# Patient Record
Sex: Female | Born: 1980 | Hispanic: No | Marital: Single | Smoking: Former smoker
Health system: Southern US, Community
[De-identification: ages and names within clinical notes are randomized; demographics above are authoritative.]

## PROBLEM LIST (undated history)

## (undated) DIAGNOSIS — J45909 Unspecified asthma, uncomplicated: Secondary | ICD-10-CM

## (undated) DIAGNOSIS — N893 Dysplasia of vagina, unspecified: Secondary | ICD-10-CM

## (undated) DIAGNOSIS — C801 Malignant (primary) neoplasm, unspecified: Secondary | ICD-10-CM

## (undated) DIAGNOSIS — F32A Depression, unspecified: Secondary | ICD-10-CM

## (undated) DIAGNOSIS — M419 Scoliosis, unspecified: Secondary | ICD-10-CM

## (undated) DIAGNOSIS — M549 Dorsalgia, unspecified: Secondary | ICD-10-CM

## (undated) DIAGNOSIS — F329 Major depressive disorder, single episode, unspecified: Secondary | ICD-10-CM

## (undated) DIAGNOSIS — M8580 Other specified disorders of bone density and structure, unspecified site: Secondary | ICD-10-CM

## (undated) DIAGNOSIS — F909 Attention-deficit hyperactivity disorder, unspecified type: Secondary | ICD-10-CM

## (undated) DIAGNOSIS — K529 Noninfective gastroenteritis and colitis, unspecified: Secondary | ICD-10-CM

## (undated) HISTORY — DX: Attention-deficit hyperactivity disorder, unspecified type: F90.9

## (undated) HISTORY — PX: DIAGNOSTIC LAPAROSCOPY: SUR761

## (undated) HISTORY — DX: Dysplasia of vagina, unspecified: N89.3

## (undated) HISTORY — PX: ABDOMINAL HYSTERECTOMY: SHX81

## (undated) HISTORY — DX: Scoliosis, unspecified: M41.9

## (undated) HISTORY — DX: Other specified disorders of bone density and structure, unspecified site: M85.80

## (undated) HISTORY — DX: Noninfective gastroenteritis and colitis, unspecified: K52.9

## (undated) HISTORY — PX: WISDOM TOOTH EXTRACTION: SHX21

---
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
## 2002-05-27 ENCOUNTER — Emergency Department (HOSPITAL_COMMUNITY): Admission: EM | Admit: 2002-05-27 | Discharge: 2002-05-28 | Payer: Self-pay | Admitting: *Deleted

## 2004-01-25 ENCOUNTER — Other Ambulatory Visit: Admission: RE | Admit: 2004-01-25 | Discharge: 2004-01-25 | Payer: Self-pay | Admitting: Gynecology

## 2005-11-24 ENCOUNTER — Emergency Department (HOSPITAL_COMMUNITY): Admission: EM | Admit: 2005-11-24 | Discharge: 2005-11-24 | Payer: Self-pay | Admitting: Emergency Medicine

## 2005-11-28 ENCOUNTER — Emergency Department (HOSPITAL_COMMUNITY): Admission: EM | Admit: 2005-11-28 | Discharge: 2005-11-29 | Payer: Self-pay | Admitting: Emergency Medicine

## 2005-12-10 ENCOUNTER — Other Ambulatory Visit: Admission: RE | Admit: 2005-12-10 | Discharge: 2005-12-10 | Payer: Self-pay | Admitting: Gynecology

## 2006-02-21 ENCOUNTER — Emergency Department (HOSPITAL_COMMUNITY): Admission: EM | Admit: 2006-02-21 | Discharge: 2006-02-21 | Payer: Self-pay | Admitting: Emergency Medicine

## 2006-11-24 ENCOUNTER — Emergency Department: Payer: Self-pay | Admitting: Unknown Physician Specialty

## 2007-02-14 ENCOUNTER — Emergency Department (HOSPITAL_COMMUNITY): Admission: EM | Admit: 2007-02-14 | Discharge: 2007-02-14 | Payer: Self-pay | Admitting: Family Medicine

## 2007-03-10 ENCOUNTER — Emergency Department (HOSPITAL_COMMUNITY): Admission: EM | Admit: 2007-03-10 | Discharge: 2007-03-10 | Payer: Self-pay | Admitting: Emergency Medicine

## 2007-03-11 ENCOUNTER — Ambulatory Visit (HOSPITAL_COMMUNITY): Admission: RE | Admit: 2007-03-11 | Discharge: 2007-03-11 | Payer: Self-pay | Admitting: Emergency Medicine

## 2007-10-07 ENCOUNTER — Encounter: Admission: RE | Admit: 2007-10-07 | Discharge: 2007-10-07 | Payer: Self-pay | Admitting: Cardiology

## 2007-11-06 HISTORY — PX: PELVIC LAPAROSCOPY: SHX162

## 2008-01-23 ENCOUNTER — Encounter: Admission: RE | Admit: 2008-01-23 | Discharge: 2008-01-23 | Payer: Self-pay | Admitting: Gastroenterology

## 2008-04-14 ENCOUNTER — Encounter (INDEPENDENT_AMBULATORY_CARE_PROVIDER_SITE_OTHER): Payer: Self-pay | Admitting: Surgery

## 2008-04-15 ENCOUNTER — Inpatient Hospital Stay (HOSPITAL_COMMUNITY): Admission: RE | Admit: 2008-04-15 | Discharge: 2008-04-16 | Payer: Self-pay | Admitting: Surgery

## 2008-11-27 IMAGING — CT CT ENTERO PELVIS W/CM
2 of 5 series · 16 of 46 positions shown, 18 images · IV contrast (VOLUMEN & [ID] OMNI 300)
Comparison: CT of the abdomen dated 10/07/07.

CLINICAL DATA: Recent small bowel obstruction.  Nausea and vomiting.  History of endodermal yolk sac tumor as child.  Status-post hysterectomy.  
CT ENTEROGRAPHY OF ABDOMEN WITH CONTRAST:
TECHNIQUE: Multidetector CT imaging of the abdomen was performed following administration of 9719 cc of VoLumen low attenuation enteric contrast.  Images were acquired after bolus injection of nonionic intravenous contrast with coronal and sagittal reformations reviewed for assessment of the small bowel mucosa.
Contrast:  100 cc Omnipaque 300
TECHNIQUE: Multidetector CT imaging of the pelvis was performed following administration of 9719 cc of VoLumen low attenuation enteric contrast.  Images were acquired after bolus injection of nonionic intravenous contrast with coronal and sagittal reformations reviewed for assessment of the small bowel mucosa.

[Series 3: enterography · axial · 0.60mm/px · z∈[-394,-32]mm · 13 of 140 slices shown, 15 images]
[im 7/140  soft-tissue]
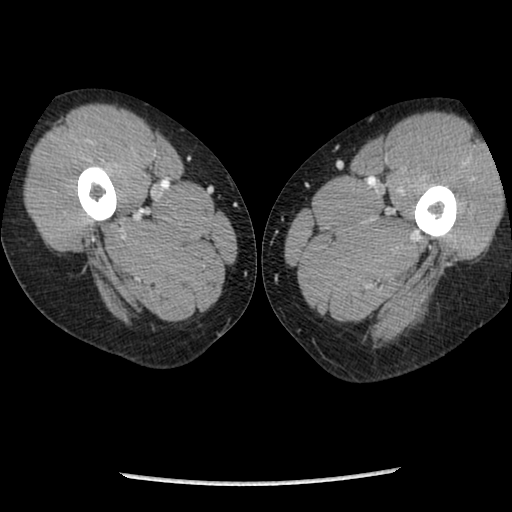
[im 7/140  bone]
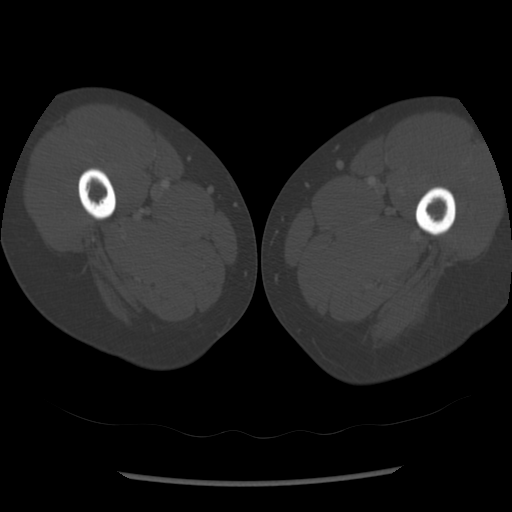
[im 20/140  soft-tissue]
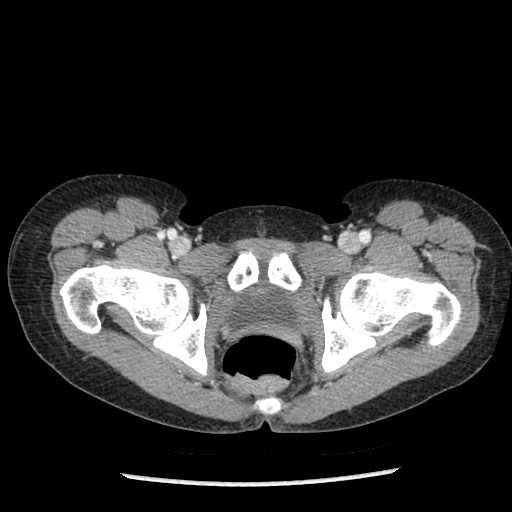
[im 27/140  soft-tissue]
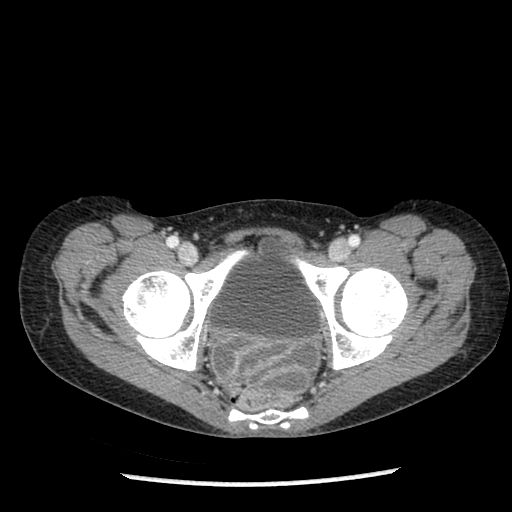
[im 40/140  soft-tissue]
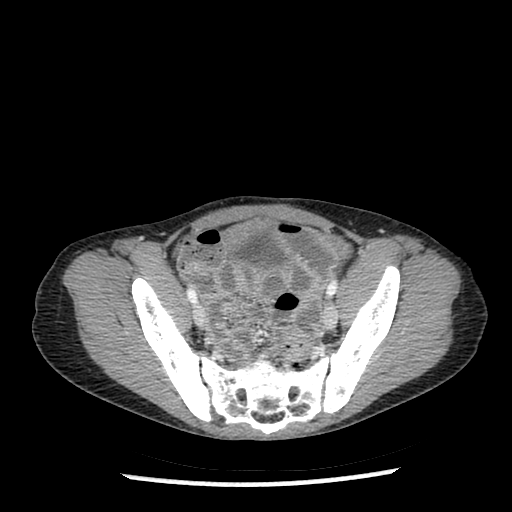
[im 47/140  soft-tissue]
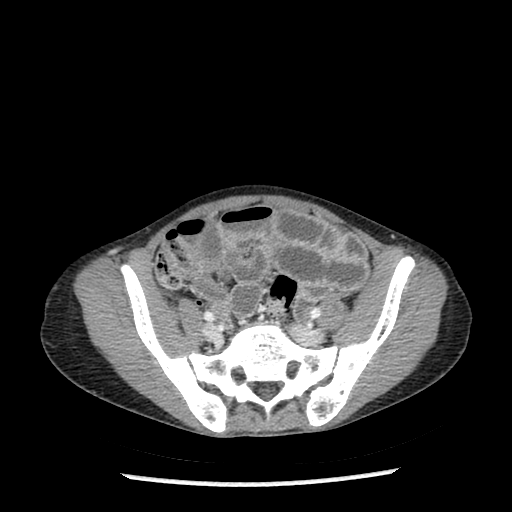
[im 60/140  soft-tissue]
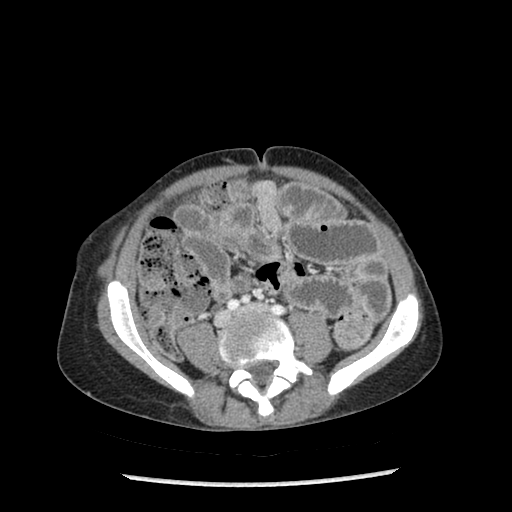
[im 73/140  soft-tissue]
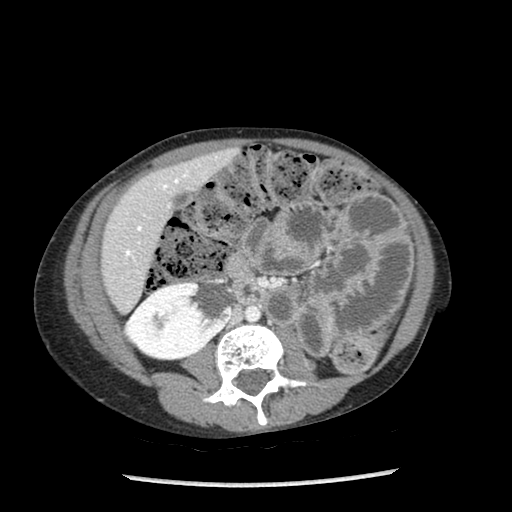
[im 80/140  soft-tissue]
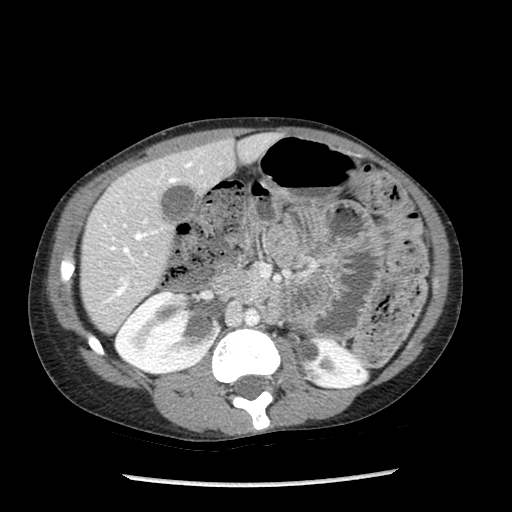
[im 93/140  soft-tissue]
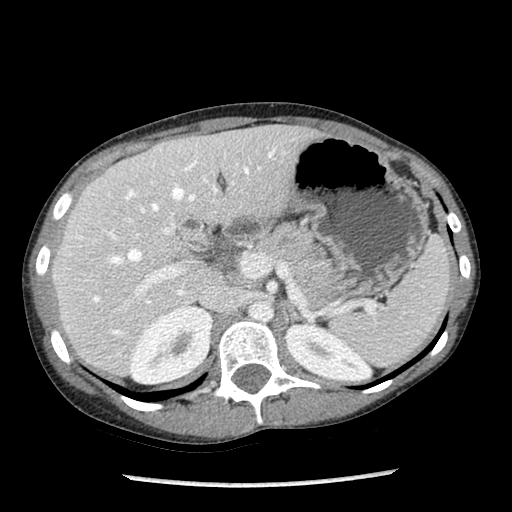
[im 93/140  bone]
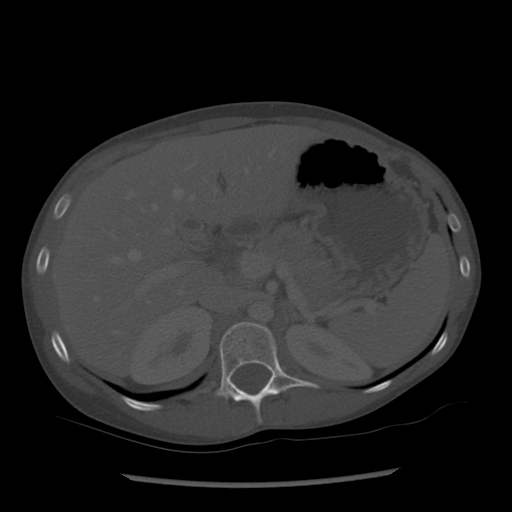
[im 100/140  soft-tissue]
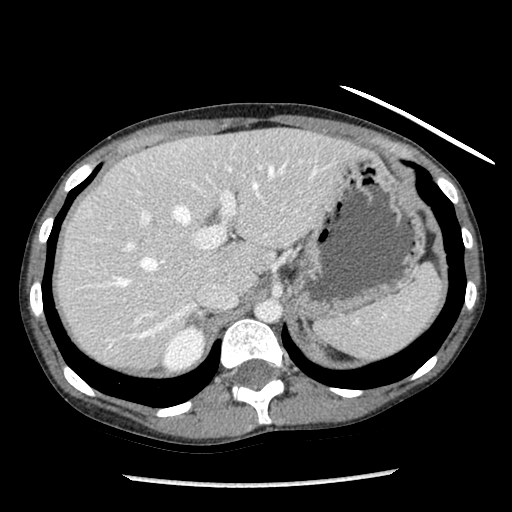
[im 113/140  soft-tissue]
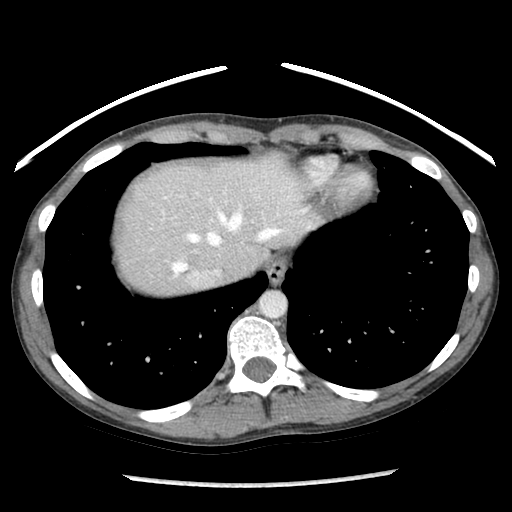
[im 120/140  soft-tissue]
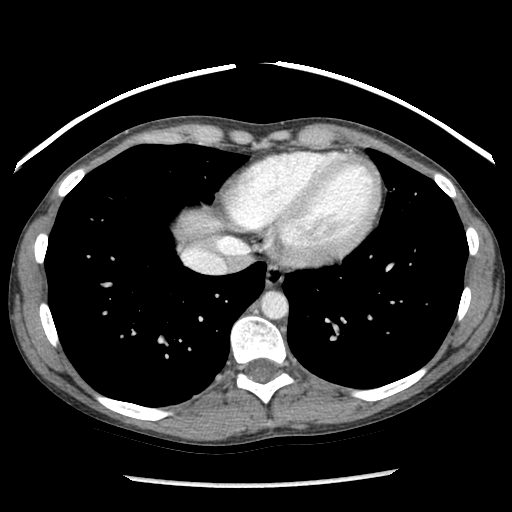
[im 133/140  soft-tissue]
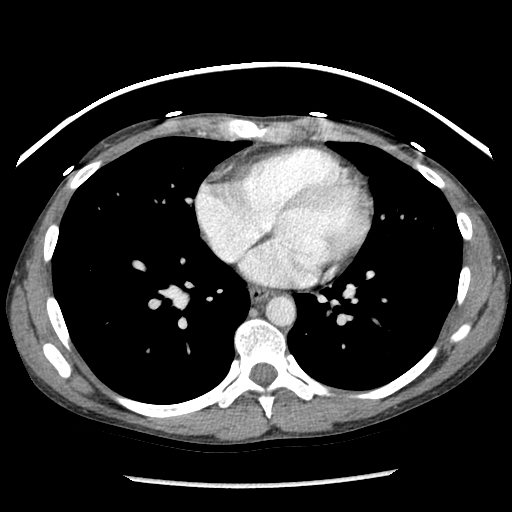

[Series 602: sagittal body · sagittal · 0.78mm/px · 3 of 124 slices shown]
[im 42/124  soft-tissue]
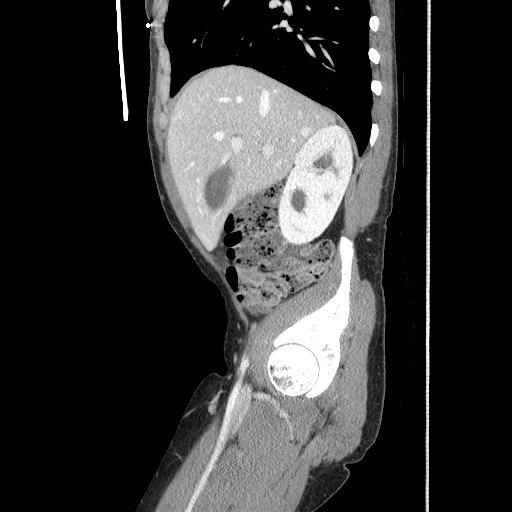
[im 55/124  soft-tissue]
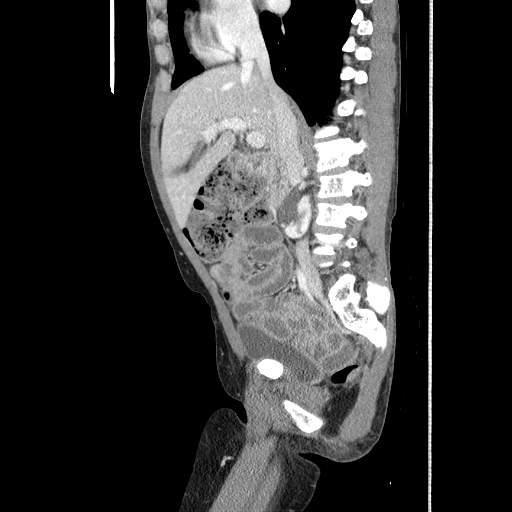
[im 69/124  soft-tissue]
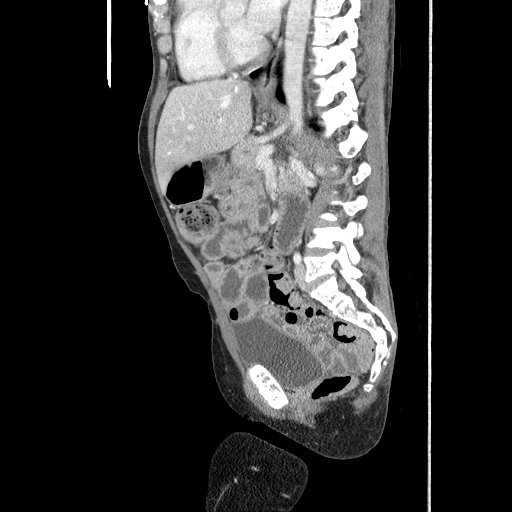

[16 of 46 positions shown; findings below may reference images not displayed]

FINDINGS: The liver is upper limits of normal in size without focal abnormality.  
The spleen, adrenal glands, gallbladder, and pancreas are unremarkable.  Fullness of both intrarenal collecting systems is again noted, right greater than left with atrophic left lower moiety. 
No enlarged lymph nodes, free fluid, biliary dilatation, or aortic aneurysm. 
Fluid is present throughout the small bowel without evidence of bowel obstruction.  Moderate stool throughout the colon is identified.  A 1 cm enhancing lesion within a small bowel loop in the left abdomen (image 73, series 3, image 63, series 601, and image 79, series 602) is suspicious for a mass or polyp.  Further evaluation/capsule endoscopy recommended.
IMPRESSION: 1.  1 cm lesion in the left jejunum.  Small mass or polyp not excluded. Recommend further evaluation.
2.  No evidence of bowel obstruction. 
3.  Moderate colonic stool.
4.  ullness of the intrarenal collecting systems bilaterally with severe atrophy of the left lower renal moiety. 
CT ENTEROGRAPHY OF PELVIS WITH CONTRAST:
FINDINGS: Small bowel and colon are within normal limits except for moderate colonic stool.  The appendix is normal.  No free fluid or enlarged lymph nodes identified.   Patient is status-post hysterectomy.  The mild deformity of the lumbar spine appears congenital.
IMPRESSION: No acute abnormalities within the pelvis.

## 2009-06-09 ENCOUNTER — Encounter: Payer: Self-pay | Admitting: Women's Health

## 2009-06-09 ENCOUNTER — Other Ambulatory Visit: Admission: RE | Admit: 2009-06-09 | Discharge: 2009-06-09 | Payer: Self-pay | Admitting: Obstetrics and Gynecology

## 2009-06-09 ENCOUNTER — Ambulatory Visit: Payer: Self-pay | Admitting: Women's Health

## 2009-08-23 ENCOUNTER — Ambulatory Visit: Payer: Self-pay | Admitting: Gynecology

## 2010-10-06 ENCOUNTER — Ambulatory Visit: Payer: Self-pay | Admitting: Women's Health

## 2010-10-06 ENCOUNTER — Other Ambulatory Visit
Admission: RE | Admit: 2010-10-06 | Discharge: 2010-10-06 | Payer: Self-pay | Source: Home / Self Care | Admitting: Gynecology

## 2010-11-26 ENCOUNTER — Encounter: Payer: Self-pay | Admitting: Gastroenterology

## 2010-11-27 ENCOUNTER — Encounter: Payer: Self-pay | Admitting: *Deleted

## 2011-03-20 NOTE — Op Note (Signed)
NAMEMYKELTI, GOLDENSTEIN               ACCOUNT NO.:  [id number]   MEDICAL RECORD NO.:  [id number]          PATIENT TYPE:  AMB   LOCATION:  DAY                          FACILITY:  [facility]   PHYSICIAN:  Ardeth Sportsman, MD     DATE OF BIRTH:  [birth year]   DATE OF PROCEDURE:  04/14/2008  DATE OF DISCHARGE:                               OPERATIVE REPORT   PRIMARY CARE PHYSICIAN:  Lenard Lance at [facility].   GASTROENTEROLOGIST:  Graylin Shiver, M.D.   PREOPERATIVE DIAGNOSES:  Intermittent abdominal pain, question  obstruction, question of jejunal tumor on CT enterography.   POSTOPERATIVE DIAGNOSES:  1. No evidence of endoluminal small bowel tumor.  2. Proximal jejunal small serosal nodules consistent with scarring      from prior radiation.  3. 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  4. 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  5. No evidence of retroperitoneal or intra-abdominal mass status post      hysterectomy, bilateral oophorectomy.   SURGEON:  Ardeth Sportsman, M.D.   ASSISTANT:  Leonie Man, M.D.   ANESTHESIA:  1. General anesthesia.  2. Local anesthetic and field block around all port sites.   PROCEDURE PERFORMED:  1. Diagnostic laparoscopy.  2. Small bowel serosal nodule excisional biopsy x2.  3. Minilaparotomy.   SPECIMENS:  1. Peritoneal fluid for aspiration for culture and cytology.  2. Small bowel serosal nodules x2.  Frozen analysis consistent with      scarring and fibrosis, most consistent with prior radiation      exposure.   DRAINS:  None.   ESTIMATED BLOOD LOSS:  5 mL.   COMPLICATIONS:  None.   INDICATIONS:  Ms. 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  She had  postop radiation and chemotherapy.  She has been struggling with  abdominal problems for most of  her life.  She has episodes of severe  nausea, vomiting and bloating and occasional diarrhea.  She is followed  by Dr. Evette Cristal with [facility] gastroenterology and workup.  She had a CAT scan  that was concerning for a possible small bowel obstruction when she was  having symptomatology.  She had a CT enterography which showed evidence  of a possible small bowel tumor in the jejunum in the left side.  Therefore, surgical consultation was requested.   Anatomy and physiology of the digestive tract was explained.  Pathophysiology of intraluminal tumors and obstruction versus prior  adhesions and other differential diagnoses were discussed.  Her risks,  benefits and alternatives were discussed.  Recommendation was made for  diagnostic laparoscopy with possible laparotomy and possible small bowel  resection and other interventions discussed.  Risks, benefits and  alternatives discussed and she agreed to proceed.  I had discussion just  prior to surgery on the day of surgery as well, questions answered, and  she agreed to proceed.  OPERATIVE FINDINGS:  She had no evidence of any intra-abdominal  adhesions.  There is no evidence of any internal herniation or  malrotation.  Her small bowel was completely of normal caliber.  She is  status post hysterectomy and oophorectomy.  She did have some slightly  greenish milky peritoneal fluid down in her pelvis, in Morison pouch  retrohepatic.  This was aspirated.  She did not have strong evidence of  peritonitis.  Her appendix and colon was normal.   She did have segments in her mid jejunum that had some scarring and  discoloration consistent with prior radiation therapy.  However, the  small bowel was not markedly thickened at this region.  There was no  evidence of any obstruction.  Small bowel was normal diameter  throughout.  There was no evidence of any retroperitoneal mass or other  abnormality.  There was no evidence of any visceral or parietal   peritoneal masses or liver masses, or kidney masses, or any other  abnormalities.   DESCRIPTION OF PROCEDURE:  />  Informed consent was confirmed.  The patient received IV antibiotics  prior to surgery.  She underwent general anesthesia without any  difficulty.  We gave her IV fluconazole as well as given her strong  history of yeast infections with this.  She was positioned supine on the  split leg table.  Her abdomen was prepped and draped in a sterile  fashion.   A 5-mm port was placed in the left upper quadrant with the patient in  steep reverse Trendelenburg and left side up using optical entry  technique and after the patient had an orogastric tube decompressing her  stomach.  Port placed easily without any difficulty.  Camera inspection  revealed no intra-abdominal injury.  Under direct visualization 5-mm  ports were placed in the left flank and suprapubically for prior  Pfannenstiel incision well away from her decompressed bladder.   Camera inspection revealed findings noted above.  She had no evidence of  really any adhesions or any other abnormalities.  I did run her small  bowel laparoscopically from her cecum all the way to the ligament of  Treitz.  Her cecum was not particularly floppy like a bascule or any  other abnormality there.  On running her small bowel she did have  segments in her mid jejunum that were discolored but not necessarily  particularly thickened.  I did see one or two spots of scarring on the  antimesenteric side of the serosa of the small bowel of little white  patches sclerosing that were anywhere from 1-4 mm length of tiny strips.  They did not seem thickened.  More proximally there was 6-mm evidence of  a serosal AVM as well.  There were a couple of spots of tiny nodules on  the serosa, and had one particular spot of 1-3 mm in size.  A camera  inspection again was done of all the other organs and there was no  abnormality.  I could see her  retroperitoneum very well, I could easily  see her retroperitoneal organs, ureter, kidney, adrenal gland, and  pancreas without any difficulty since she was very, very thin.  We did  take pictures.   Given the fact that there were some abnormalities on the small bowel  that were difficult to feel, I did do a 4-cm supraumbilical midline  incision;  after capnoperitoneum  was evacuated.  We placed a wound  protector and could run the small bowel by  hand all the way from the  ileocecal valve to the ligament of Treitz.  The areas of discoloration  concerning for prior radiation therapy were not particularly  thick-  walled.  The peristalsis was slightly less but there was definite  peristalsis within these loops.  The mesentery had normal size lymph  nodes, no evidence of any mesenteric thickening or abnormality.  There  was no evidence of any obstruction or significant change in her caliber  at all.  The white patches of scarring seen before on the antimesenteric  side in about three spaces of the small bowel were very small and  nonobstructing and not thickened.   I did find one patch with a couple of 1 to 2-mm small bowel nodules on  the serosa.  We went ahead and excised this and sent this for frozen  specimen.  It came back consistent with scar and not of concern.  I went  ahead and placed 3-0 silk stitches to help imbricate that area and it  was not obstructing either.  I came across the serosal AVM.  There was  no thickening or abnormality there.  As she was very thin, I could  easily palpate, and there was no evidence of any endoluminal mass.  I  did not do intraoperative endoscopy as I could easily feel and palpate  things.   I went ahead and closed the midline incision using #1 PDS.  The  capnoperitoneum was reintroduced and there was good closure of the  peritoneum.  Reinspection revealed no intra-abdominal injury or leak or  abnormality.  I did take some pictures and again  there was no evidence  of concern.  Based on this fact, I felt that it would not be of benefit  to resect any the radiation areas as they did not seem obstructing and  did seem to show evidence of peristalsis, so we decided to stop the  procedure.  Capnoperitoneum was evacuated and the remaining ports  removed.  Skin was closed using a 4-0 Monocryl stitch.  Sterile dressing  was applied.  The patient was extubated and sent to the recovery room in  stable condition.   I talked to Dr. Evette Cristal and explained the findings.  We will wait for  final pathology results.  I explained the operative findings to the  patient's family.      Ardeth Sportsman, MD  Electronically Signed     SCG/MEDQ  D:  04/14/2008  T:  04/14/2008  Job:  [id number]   cc:   Graylin Shiver, M.D.  Fax: [phone]   Lenard Lance  Fax: [phone]

## 2011-03-23 NOTE — Op Note (Signed)
[facility]  Patient:    Pamela Wall, Pamela Wall Visit Number: [id number] MRN: [id number]          Service Type: END Location: DAY Attending Physician:  Jonathon Bellows Dictated by:   Roetta Sessions, M.D. Proc. Date: 10/06/01 Admit Date:  10/06/2001   CC:         Wyvonnia Lora, M.D., [place], [place]  Zigmund Gottron, M.D.,  [place], [place]   Operative Report  PROCEDURE:  Colonoscopy with biopsy.  ENDOSCOPIST:  Roetta Sessions, M.D.  INDICATIONS:  The patient is an 30 year old lady with essentially lifelong diarrhea.  Colonoscopy is now being done to further evaluate her symptoms. She has a history of pelvic radiation for uterine cancer.  This approach has been discussed with the patient previously.  The potential risks, benefits and alternatives have been reviewed; please see the office documentation and my handwritten updated H&P for more information.  I think  she is at low risk for conscious sedation with Versed and Demerol.  MONITORING:  O2 saturation, blood pressure, pulse, respirations were monitored throughout the entire procedure.  CONSCIOUS SEDATION:  IV Versed and Demerol in incremental doses.  INSTRUMENT:  Olympus video-chip colonoscope.  FINDINGS:  Digital rectal examination revealed no abnormalities.  Endoscopic findings: The prep was good.  Rectum: Examination of the rectal mucosa including retroflexed view of the anal verge revealed no abnormalities.  Colon: The colonic mucosa was surveyed from the rectosigmoid junction through the left transverse and  right colon to the area of the appendiceal orifice, the ileocecal valve and cecum.  These structures were well seen and photographed.  The colonic mucosa appeared normal all the way to the cecum. The ileocecal valve was skinny.  I attempted to intubate the terminal ileum but was unable to do so.  From the level of the cecum and ileocecal valve the scope was slowly withdrawn and all previously  mentioned mucosal surfaces were again seen.  There was a 5 mm polyp at 20 cm which was cold biopsied/removed. Also segmental biopsies of the descending, sigmoid and rectal mucosa were taken separately.  The mucosa in these segments otherwise appeared normal. Also there was some stool residue in the colon which was suctioned for studies.  The patient tolerated the procedure very well and was reacted.  IMPRESSION: 1. Normal rectum. 2. Diminutive polyp at 20 cm cold biopsied/removed. 3. The remainder of colonic mucosa appeared normal status post stool sampling    and biopsies as described above.  RECOMMENDATIONS: 1. Follow up on stool studies and biopsies. 2. Further recommendations to follow. Dictated by:   Roetta Sessions, M.D. Attending Physician:  Jonathon Bellows DD:  10/06/01 TD:  10/06/01 Job: [id number]

## 2011-03-23 NOTE — Op Note (Signed)
[facility]  Patient:    JAMARIA, AMBORN Visit Number: [id number] MRN: [id number]          Service Type: END Location: DAY Attending Physician:  Jonathon Bellows Dictated by:   Roetta Sessions, M.D. Proc. Date: 12/04/01 Admit Date:  12/04/2001                             Operative Report  PROCEDURE:  Esophagogastroduodenoscopy with small bowel biopsy.  INDICATIONS FOR PROCEDURE:  The patient is a 30 year old lady with chronic diarrhea.  She has history of radiation therapy to her pelvis for uterine tumor at 2 months of age.  She had a small bowel follow through which demonstrated thickening, abnormal-appearing small bowel mucosa.  Celiac antibody was negative.  She has significant gastroesophageal reflux symptoms.  EGD is now being done to look at her small bowel and obtain biopsies as needed.  This approach has been discussed with the patient previously and again today at the bedside. Potential risks, benefits and alternatives have been reviewed and questions answered.  She is agreeable.  Please see my handwritten H&P for more information.  She is at low risk for conscious sedation with Versed and Demerol  DESCRIPTION OF PROCEDURE:  The patient was placed in the left lateral decubitus position.  Oxygen saturation, blood pressure, pulse, respirations were monitored throughout the entire procedure.  Conscious sedation:  Versed 5 mg, Demerol 75 mg IV in divided doses.  Cetacaine spray for topical oropharyngeal anesthesia.  Instrument was the Olympus videotape gastroscope.  FINDINGS:  Examination of tubular esophagus revealed multiple distal esophageal erosions.  There was one relatively long 3 cm erosion extending  up from the EG junction.  There was also multiple smaller circumferential erosions.  The EG junction was somewhat patulous.  There was no Barretts esophagus.  The EG junction was easily traversed.  STOMACH:  The gastric cavity was  empty, insufflated well with air.  A thorough examination of the gastric mucosa including retroflexed view of the proximal stomach, esophagogastric junction demonstrated only a small hiatal hernia. Pylorus was patent and easily traversed.  DUODENUM:  Second and third portion appeared somewhat pale and had a little difficulty fully distending the second and third portion with air.  Second and third portions were biopsied.  The patient tolerated the procedure and was reactive to endoscopy.  IMPRESSION: 1. Multiple distal esophageal erosions with a moderately severe    erosive reflux esophagitis, patulous esophagogastric junction. 2. Small hiatal hernia.  The remainder of the stomach appeared    normal. 3. Slightly pale and poorly distensible second and third portions    of the duodenum, status post biopsies.  RECOMMENDATIONS: 1. Will start Aciphex 20 mg orally daily, 30 minutes before    breakfast; antireflux measures emphasized.  Literature provided on    gastroesophageal reflux disease. 2. Follow up on pathology. 3. Further recommendations to follow. Dictated by:   Roetta Sessions, M.D. Attending Physician:  Jonathon Bellows DD:  12/04/01 TD:  12/04/01 Job: [id number]

## 2011-05-04 ENCOUNTER — Ambulatory Visit: Payer: Self-pay | Admitting: Women's Health

## 2011-05-18 ENCOUNTER — Encounter: Payer: Self-pay | Admitting: Women's Health

## 2011-07-25 ENCOUNTER — Encounter: Payer: Self-pay | Admitting: Women's Health

## 2011-07-25 ENCOUNTER — Ambulatory Visit (INDEPENDENT_AMBULATORY_CARE_PROVIDER_SITE_OTHER): Payer: Medicaid Other | Admitting: Women's Health

## 2011-07-25 ENCOUNTER — Encounter: Payer: Self-pay | Admitting: Gynecology

## 2011-07-25 ENCOUNTER — Ambulatory Visit: Payer: Self-pay | Admitting: Women's Health

## 2011-07-25 DIAGNOSIS — A499 Bacterial infection, unspecified: Secondary | ICD-10-CM

## 2011-07-25 DIAGNOSIS — B9689 Other specified bacterial agents as the cause of diseases classified elsewhere: Secondary | ICD-10-CM

## 2011-07-25 DIAGNOSIS — N760 Acute vaginitis: Secondary | ICD-10-CM

## 2011-07-25 DIAGNOSIS — N898 Other specified noninflammatory disorders of vagina: Secondary | ICD-10-CM

## 2011-07-25 DIAGNOSIS — B373 Candidiasis of vulva and vagina: Secondary | ICD-10-CM

## 2011-07-25 MED ORDER — METRONIDAZOLE 0.75 % VA GEL: VAGINAL | Status: AC

## 2011-07-25 MED ORDER — FLUCONAZOLE 150 MG PO TABS: 150.0000 mg | ORAL_TABLET | Freq: Once | ORAL | Status: AC

## 2011-07-25 NOTE — Progress Notes (Signed)
  Patient presents with a complaint of a bump in the vaginal area that is slightly tender. Denies  pain or burning with urination, or fever. States has had slight discharge. Abdomen is soft nontender external genitalia there is 1 mm area on the left vaginal opening that appears to be white, questionable yeast versus condyloma. 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 Of significance she did break off 7y engagement, and is sexually active with a new partner with condoms.  BV, yeast, questionable condyloma  Plan: MetroGel vaginal cream 1 applicator at bedtime x5, Diflucan 150 by mouth x1 dose yeast prevention discussed, will return for an annual, HIV, hepatitis and RPR and Pap smear.

## 2011-08-02 LAB — DIFFERENTIAL
Basophils Relative: 1
Eosinophils Relative: 3
Lymphocytes Relative: 39
Monocytes Absolute: 0.3
Monocytes Relative: 5
Neutro Abs: 3.1
Neutrophils Relative %: 54

## 2011-08-02 LAB — ANAEROBIC CULTURE: Gram Stain: NONE SEEN

## 2011-08-02 LAB — BODY FLUID CULTURE

## 2011-08-02 LAB — CBC
MCV: 94.5
WBC: 5.9

## 2011-08-09 ENCOUNTER — Other Ambulatory Visit: Payer: Self-pay | Admitting: Gynecology

## 2011-08-16 ENCOUNTER — Encounter: Payer: Self-pay | Admitting: Gynecology

## 2011-08-16 ENCOUNTER — Ambulatory Visit (INDEPENDENT_AMBULATORY_CARE_PROVIDER_SITE_OTHER): Payer: Medicaid Other | Admitting: Gynecology

## 2011-08-16 VITALS — BP 110/70 | Wt 104.0 lb

## 2011-08-16 DIAGNOSIS — B373 Candidiasis of vulva and vagina: Secondary | ICD-10-CM

## 2011-08-16 DIAGNOSIS — R82998 Other abnormal findings in urine: Secondary | ICD-10-CM

## 2011-08-16 DIAGNOSIS — L293 Anogenital pruritus, unspecified: Secondary | ICD-10-CM

## 2011-08-16 DIAGNOSIS — R300 Dysuria: Secondary | ICD-10-CM

## 2011-08-16 DIAGNOSIS — N898 Other specified noninflammatory disorders of vagina: Secondary | ICD-10-CM

## 2011-08-16 DIAGNOSIS — N390 Urinary tract infection, site not specified: Secondary | ICD-10-CM

## 2011-08-16 MED ORDER — FLUCONAZOLE 200 MG PO TABS: 200.0000 mg | ORAL_TABLET | Freq: Every day | ORAL | Status: AC

## 2011-08-16 MED ORDER — CIPROFLOXACIN HCL 250 MG PO TABS: 250.0000 mg | ORAL_TABLET | Freq: Two times a day (BID) | ORAL | Status: AC

## 2011-08-16 NOTE — Patient Instructions (Signed)
Take medications as prescibed

## 2011-08-16 NOTE — Progress Notes (Signed)
Patient notes some frequency and dysuria as well as vaginal itching of the last week to 2 weeks. No real discharge. No fevers chills low back pain or other constitutional symptoms.  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  Exam Spine: Straight without paraspinous muscle spasm or CVA tenderness Abdomen: Soft nontender without masses guarding rebound organomegaly. Pelvic: External BUS of vagina with slight white discharge vaginal length shortened consistent with her past surgical history  Assessment and plan 1. Vaginal discharge, itching. KOH wet prep is positive for yeast. She recently was treated by Harriett Sine with Diflucan 150x1 dose. We'll go ahead and treat with Diflucan 200 daily x3 days follow up if symptoms persist or recur. 2. Urgency frequency. UA suspicious for UTI. Will cover with ciprofloxacin 250 twice a day x7 days given her history of radiation treatment in the past. To follow up if her symptoms persist or recur

## 2011-08-17 ENCOUNTER — Ambulatory Visit: Payer: Medicaid Other | Admitting: Gynecology

## 2011-08-17 ENCOUNTER — Telehealth: Payer: Self-pay | Admitting: *Deleted

## 2011-08-17 NOTE — Telephone Encounter (Signed)
Pt called stating pharmacy never got Rx for Cipro. Rx called in to pharmacy per request.

## 2011-09-06 DIAGNOSIS — N893 Dysplasia of vagina, unspecified: Secondary | ICD-10-CM

## 2011-09-06 HISTORY — DX: Dysplasia of vagina, unspecified: N89.3

## 2011-09-13 ENCOUNTER — Encounter: Payer: Self-pay | Admitting: Women's Health

## 2011-09-13 ENCOUNTER — Other Ambulatory Visit (HOSPITAL_COMMUNITY)
Admission: RE | Admit: 2011-09-13 | Discharge: 2011-09-13 | Disposition: A | Discharge status: Home / Self Care | Payer: Medicaid Other | Source: Ambulatory Visit | Attending: Women's Health | Admitting: Women's Health

## 2011-09-13 ENCOUNTER — Ambulatory Visit (INDEPENDENT_AMBULATORY_CARE_PROVIDER_SITE_OTHER): Payer: Medicaid Other | Admitting: Women's Health

## 2011-09-13 VITALS — BP 110/72 | Ht 61.0 in | Wt 105.0 lb

## 2011-09-13 DIAGNOSIS — N898 Other specified noninflammatory disorders of vagina: Secondary | ICD-10-CM

## 2011-09-13 DIAGNOSIS — A499 Bacterial infection, unspecified: Secondary | ICD-10-CM

## 2011-09-13 DIAGNOSIS — K529 Noninfective gastroenteritis and colitis, unspecified: Secondary | ICD-10-CM | POA: Insufficient documentation

## 2011-09-13 DIAGNOSIS — B9689 Other specified bacterial agents as the cause of diseases classified elsewhere: Secondary | ICD-10-CM

## 2011-09-13 DIAGNOSIS — R82998 Other abnormal findings in urine: Secondary | ICD-10-CM

## 2011-09-13 DIAGNOSIS — Z124 Encounter for screening for malignant neoplasm of cervix: Secondary | ICD-10-CM

## 2011-09-13 DIAGNOSIS — M419 Scoliosis, unspecified: Secondary | ICD-10-CM | POA: Insufficient documentation

## 2011-09-13 DIAGNOSIS — N760 Acute vaginitis: Secondary | ICD-10-CM

## 2011-09-13 DIAGNOSIS — M8580 Other specified disorders of bone density and structure, unspecified site: Secondary | ICD-10-CM | POA: Insufficient documentation

## 2011-09-13 DIAGNOSIS — Z01419 Encounter for gynecological examination (general) (routine) without abnormal findings: Secondary | ICD-10-CM | POA: Insufficient documentation

## 2011-09-13 DIAGNOSIS — L293 Anogenital pruritus, unspecified: Secondary | ICD-10-CM

## 2011-09-13 MED ORDER — METRONIDAZOLE 0.75 % VA GEL: VAGINAL | Status: AC

## 2011-09-13 NOTE — Progress Notes (Signed)
Pamela Wall Jun 01, 1981 [id number]    History:    The patient presents for complaint of discharge. Has full custody of niece and nephew ages 28 and 28. Sister drug addict, children's father drug addict and abuse. 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   ROS:  A  ROS was performed and pertinent positives and negatives are included in the history.  Exam:  Filed Vitals:   09/13/11 1425  BP: 110/72    General appearance:  Normal Head/Neck:  Normal, without cervical or supraclavicular adenopathy. Thyroid:  Symmetrical, normal in size, without palpable masses or nodularity. Respiratory  Effort:  Normal  Auscultation:  Clear without wheezing or rhonchi Cardiovascular  Auscultation:  Regular rate, without rubs, murmurs or gallops  Edema/varicosities:  Not grossly evident Abdominal  Soft,nontender, without masses, guarding or rebound.  Liver/spleen:  No organomegaly noted  Hernia:  None appreciated  Skin  Inspection:  Grossly normal  Palpation:  Grossly normal Neurologic/psychiatric  Orientation:  Normal with appropriate conversation.  Mood/affect:  Normal  Genitourinary    Breasts: Examined lying and sitting.     Right: Without masses, retractions, discharge or axillary adenopathy.     Left: Without masses, retractions, discharge or axillary adenopathy.   Inguinal/mons:  Normal without inguinal adenopathy  External genitalia:  Normal  BUS/Urethra/Skene's glands:  Normal  Bladder:  Normal  Vagina:  contracted  Cervix:  absentl  Uterus:  absent  Adnexa/parametria:     Rt: Without masses or tenderness.   Lt: Without masses or tenderness.  Anus and perineum: Normal  Digital rectal exam: Normal sphincter tone without palpated masses or tenderness  Assessment/Plan:  30 y.o. SWF G0 for complaint of vaginal discharge and slight burning with urination. History of a TAH/BSO  for endodermal sinus tumor at age 28 months, cancer free since. On ERT per primary care/"natural"cream. History of osteoporosis, had been on Fosamax weekly. We do not have a copy of her DEXA will repeat scan and have report sent to our office. UA 3-5 WBCs trace bacteria.  BV History of osteoporosis/scoliosis Rule out UTI  Plan: MetroGel vaginal cream 1 applicator at bedtime x5, prescription, proper use was given and reviewed. SBEs, exercise, MVI daily, calcium rich foods encouraged. Will schedule DEXA and have results sent to our office. Currently on a plant/ natural ERT per primary care. Will continue labs at primary care, [place], will check culture. Caring for niece with severe ADHD and nephew for greater than one year now/ sister drug abuse, lost custody. Encouraged counseling for caring for her children/family issues. Encouraged vaginal lubricants with intercourse, vagina is extremely contracted/small and states is not able to have full penetration with intercourse.     Harrington Challenger WHNP, 3:20 PM 09/13/2011

## 2011-09-14 MED ORDER — SULFAMETHOXAZOLE-TRIMETHOPRIM 800-160 MG PO TABS: 1.0000 | ORAL_TABLET | Freq: Two times a day (BID) | ORAL | Status: AC

## 2011-09-14 NOTE — Progress Notes (Signed)
Addended by: Venora Maples on: 09/14/2011 02:32 PM   Modules accepted: Orders

## 2011-09-25 ENCOUNTER — Encounter: Payer: Self-pay | Admitting: Gynecology

## 2011-09-25 ENCOUNTER — Ambulatory Visit: Payer: Medicaid Other | Admitting: Gynecology

## 2011-09-25 ENCOUNTER — Ambulatory Visit (INDEPENDENT_AMBULATORY_CARE_PROVIDER_SITE_OTHER): Payer: Medicaid Other | Admitting: Gynecology

## 2011-09-25 DIAGNOSIS — R87612 Low grade squamous intraepithelial lesion on cytologic smear of cervix (LGSIL): Secondary | ICD-10-CM

## 2011-09-25 DIAGNOSIS — A499 Bacterial infection, unspecified: Secondary | ICD-10-CM

## 2011-09-25 DIAGNOSIS — B9689 Other specified bacterial agents as the cause of diseases classified elsewhere: Secondary | ICD-10-CM

## 2011-09-25 DIAGNOSIS — B373 Candidiasis of vulva and vagina: Secondary | ICD-10-CM

## 2011-09-25 DIAGNOSIS — N890 Mild vaginal dysplasia: Secondary | ICD-10-CM

## 2011-09-25 DIAGNOSIS — N893 Dysplasia of vagina, unspecified: Secondary | ICD-10-CM

## 2011-09-25 DIAGNOSIS — R82998 Other abnormal findings in urine: Secondary | ICD-10-CM

## 2011-09-25 DIAGNOSIS — N898 Other specified noninflammatory disorders of vagina: Secondary | ICD-10-CM

## 2011-09-25 DIAGNOSIS — N760 Acute vaginitis: Secondary | ICD-10-CM

## 2011-09-25 MED ORDER — METRONIDAZOLE 500 MG PO TABS: 500.0000 mg | ORAL_TABLET | Freq: Two times a day (BID) | ORAL | Status: AC

## 2011-09-25 MED ORDER — TERCONAZOLE 0.4 % VA CREA: 1.0000 | TOPICAL_CREAM | Freq: Every day | VAGINAL | Status: AC

## 2011-09-25 NOTE — Progress Notes (Signed)
Patient presents for for colposcopy due to Pap smear showing low-grade SIL. This is her first abnormal Pap smear. 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 Also notes that she is persisting to have a vaginal discharge and itching.  Exam External BUS vagina with thick white discharge KOH wet prep done. Small raised white lesion right hymenal ring area near the posterior fourchette. Bimanual without masses or tenderness.  Colposcopic: Acetic acid cleanse with multiple raised acetyl white lesions throughout the upper vagina consistent with HPV effect. 2 representative biopsies were taken. Patient tolerated well.  Assessment and plan: 1. White discharge. KOH wet prep is positive for yeast and BV. We'll treat with Terazol 7 day cream and Flagyl 500 twice a day x7 days all points discussed. 2. 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 Representative biopsies were taken. Patient is in a new relationship and has not use condoms consistently. I think this accounts for her Pap smear.  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 She'll follow up her biopsy results as long as low-grade that we'll plan on every six-month Pap smear and follow expectantly at present.

## 2011-09-25 NOTE — Progress Notes (Signed)
Addended by: Landis Martins R on: 09/25/2011 02:59 PM   Modules accepted: Orders

## 2011-09-27 LAB — URINE CULTURE: Colony Count: >=100000

## 2011-10-01 MED ORDER — NITROFURANTOIN MONOHYD MACRO 100 MG PO CAPS: 100.0000 mg | ORAL_CAPSULE | Freq: Two times a day (BID) | ORAL | Status: AC

## 2011-10-01 NOTE — Progress Notes (Signed)
Addended by: Dara Lords on: 10/01/2011 05:01 PM   Modules accepted: Orders

## 2011-10-02 NOTE — Progress Notes (Signed)
rx called in.  escribe was down.

## 2011-10-12 ENCOUNTER — Encounter: Payer: Self-pay | Admitting: Gynecology

## 2011-10-12 ENCOUNTER — Telehealth: Payer: Self-pay | Admitting: *Deleted

## 2011-10-12 NOTE — Telephone Encounter (Signed)
Tell patient that I apologize, I thought that we had called her but the biopsy was consistent with the Pap smear showing low-grade atypia. My recommendation is to follow up for Pap smear in 6 months and I will follow her expectantly every 6 months with Pap smears.

## 2011-10-12 NOTE — Telephone Encounter (Signed)
Pt called wanting to know results from recent pathology report. Please advise

## 2011-10-15 NOTE — Telephone Encounter (Signed)
Pt informed with the below note, recall letter in computer.

## 2011-10-15 NOTE — Telephone Encounter (Signed)
Lm for pt to call

## 2011-11-22 ENCOUNTER — Other Ambulatory Visit: Payer: Medicaid Other

## 2011-11-22 ENCOUNTER — Telehealth: Payer: Self-pay | Admitting: *Deleted

## 2011-11-22 ENCOUNTER — Other Ambulatory Visit: Payer: Self-pay | Admitting: Gynecology

## 2011-11-22 DIAGNOSIS — N390 Urinary tract infection, site not specified: Secondary | ICD-10-CM

## 2011-11-22 NOTE — Telephone Encounter (Signed)
Pt called c/o recurrent UTI frequent urination and burning. Pt had OV 08/16/11 and giving cirpo 250 mg and septra ds on 09/13/11. Pt would like rx to help relief UTI. Please advise

## 2011-11-22 NOTE — Telephone Encounter (Signed)
Recommend patient drop off a urine culture and sensitivity so that we can check not only for bacteria but check what antibiotics it is sensitive to before we treat again.

## 2011-11-22 NOTE — Telephone Encounter (Signed)
PT INFORMED WITH THE BELOW NOTE, SHE WILL DROP OFF URINE CULTURE

## 2011-11-24 LAB — URINE CULTURE

## 2011-12-20 ENCOUNTER — Telehealth: Payer: Self-pay | Admitting: *Deleted

## 2011-12-20 MED ORDER — FLUCONAZOLE 150 MG PO TABS: 150.0000 mg | ORAL_TABLET | Freq: Once | ORAL | Status: DC

## 2011-12-20 MED ORDER — FLUCONAZOLE 150 MG PO TABS: 150.0000 mg | ORAL_TABLET | Freq: Once | ORAL | Status: AC

## 2011-12-20 NOTE — Telephone Encounter (Signed)
Pt informed with the below note, rx sent to pharmacy. 

## 2011-12-20 NOTE — Telephone Encounter (Signed)
Please call in Diflucan 150 with refill office visit if no relief. Thanks

## 2011-12-20 NOTE — Telephone Encounter (Signed)
Pt called c/o yeast infection x 2 days now she got bite by a cat and went to ED and was given antibody to take. Pt said she is very busy with school and working to make appointment. Itching white discharge. She would like a rx for diflucan with refills. Please advise

## 2012-02-06 ENCOUNTER — Encounter: Payer: Self-pay | Admitting: Gynecology

## 2012-02-06 ENCOUNTER — Ambulatory Visit (INDEPENDENT_AMBULATORY_CARE_PROVIDER_SITE_OTHER): Payer: Medicaid Other | Admitting: Gynecology

## 2012-02-06 DIAGNOSIS — N890 Mild vaginal dysplasia: Secondary | ICD-10-CM

## 2012-02-06 DIAGNOSIS — N898 Other specified noninflammatory disorders of vagina: Secondary | ICD-10-CM

## 2012-02-06 DIAGNOSIS — A630 Anogenital (venereal) warts: Secondary | ICD-10-CM

## 2012-02-06 DIAGNOSIS — Z113 Encounter for screening for infections with a predominantly sexual mode of transmission: Secondary | ICD-10-CM

## 2012-02-06 DIAGNOSIS — N893 Dysplasia of vagina, unspecified: Secondary | ICD-10-CM

## 2012-02-06 LAB — WET PREP FOR TRICH, YEAST, CLUE
Trich, Wet Prep: NONE SEEN
WBC, Wet Prep HPF POC: NONE SEEN

## 2012-02-06 MED ORDER — CLINDAMYCIN PHOSPHATE 2 % VA CREA: 1.0000 | TOPICAL_CREAM | Freq: Every day | VAGINAL | Status: AC

## 2012-02-06 NOTE — Patient Instructions (Signed)
Follow up for biopsy results. Use Cleocin vaginal cream as prescribed. Follow up for annual exam with Pap smear next 1-2 months.

## 2012-02-06 NOTE — Progress Notes (Signed)
Patient ID: Pamela Wall, female   DOB: 1981-06-17, 31 y.o.   MRN: [id number]  Patient presents with several issues. She felt a bump on the outer side of her vulva and is worried that this may be a condyloma wants to have a suspected. 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 She also asked about STD screening. She has no known exposures but would prefer to be screened recently getting out of a relationship.  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  Exam was Sherrilyn Rist chaperone present External with classic condyloma right upper labia majora. No other lesions noted. Clitoris generous in size. BUS vagina with condylomatous-appearing growth at the hymenal ring 7:00 position into the vagina approximately 1 cm long. Thick white discharge. No other palpable or visible vaginal abnormalities. Bimanual without masses or tenderness.  Marland KitchenPhysical Exam  Genitourinary:       Assessment and plan: 1. White discharge. Wet prep is consistent with BV. The patient's choice we'll treat with Cleocin vaginal cream nightly times a week. 2. STD screening. I did a GC chlamydia swab of the upper vagina and urethra. Hepatitis B, hepatitis C, RPR, HIV ordered. 3. Warty growths consistent with condyloma. Both areas were cleansed with Betadine and infiltrated with subcutaneous 1% lidocaine. #1 was excised in its entirety sent to pathology with silver nitrate hemostasis afterwards. #2 was sampled with silver nitrate applied afterwards specimen sent to pathology. Reviewed with the patient #2 was too large to excise a degree in an office setting. Options for management to include OR excision or laser versus TCA application. We'll follow up on biopsy and of classic condyloma then we'll plan on TCA office application which may require several treatments.  4. VAIN I. Patient is due for her repeat Pap next month and she'll follow up for an annual at that  time 5. Generous clitoral size. Not overt clitoral megaly. She is on palate testosterone and estrogen with buccal progesterone through a clinic. I did discuss with her that the testosterone may be promoting some clitoral prominence although she is having no other symptoms such as voice changes acne or hair changes. I just asked her to keep an eye on this to make sure it doesn't change herself exam.

## 2012-02-07 LAB — GC/CHLAMYDIA PROBE AMP, GENITAL
Chlamydia, DNA Probe: NEGATIVE
GC Probe Amp, Genital: NEGATIVE

## 2012-02-07 LAB — HEPATITIS C ANTIBODY: HCV Ab: NEGATIVE

## 2012-02-13 ENCOUNTER — Telehealth: Payer: Self-pay | Admitting: *Deleted

## 2012-02-13 NOTE — Telephone Encounter (Signed)
Left message on pt voicemail that labs were negative.

## 2012-02-13 NOTE — Telephone Encounter (Signed)
Message copied by Aura Camps on Wed Feb 13, 2012 11:21 AM ------      Message from: Leanor Kail      Created: Wed Feb 13, 2012  9:26 AM       Toniann Fail asked Harriett Sine to write the excuses so she only needs lab results now.            Thanks      ----- Message -----         From: Leanor Kail         Sent: 02/13/2012   9:04 AM           To: Erik Obey            Patient would like a call regarding her labs drawn 02/06/12 [phone] & request a note excusing her from class & work on 4/4,5,8 & 9.            She is returning to work & school today [phone]            Thanks

## 2012-02-18 ENCOUNTER — Telehealth: Payer: Self-pay | Admitting: Gynecology

## 2012-02-18 NOTE — Telephone Encounter (Signed)
Please call and tell pt the below note.

## 2012-02-18 NOTE — Telephone Encounter (Signed)
Tell patient that both biopsies showed low-grade atypia. I recommended she return now so that we can treat that one vaginal area with TCA.  And then we'll continue to follow with reexamination and Pap smear in 6 months.

## 2012-02-19 NOTE — Telephone Encounter (Signed)
Lm for patient to call

## 2012-02-19 NOTE — Telephone Encounter (Signed)
Patient informed. Will call back with her calender to schedule.

## 2012-03-18 ENCOUNTER — Telehealth: Payer: Self-pay | Admitting: *Deleted

## 2012-03-18 NOTE — Telephone Encounter (Signed)
Pt has appointment on Friday for TCA treatment and she is currently taken prednisone, pt said on the bottle it said if having any type of procedure this medication may slow recovery time. Pt would like to know if she should reschedule appointment due taken medication? Please advise

## 2012-03-18 NOTE — Telephone Encounter (Signed)
Left message on pt voicemail

## 2012-03-18 NOTE — Telephone Encounter (Signed)
It should be okay.

## 2012-03-21 ENCOUNTER — Encounter: Payer: Self-pay | Admitting: Gynecology

## 2012-03-21 ENCOUNTER — Ambulatory Visit (INDEPENDENT_AMBULATORY_CARE_PROVIDER_SITE_OTHER): Payer: Medicaid Other | Admitting: Gynecology

## 2012-03-21 ENCOUNTER — Other Ambulatory Visit (HOSPITAL_COMMUNITY)
Admission: RE | Admit: 2012-03-21 | Discharge: 2012-03-21 | Disposition: A | Discharge status: Home / Self Care | Payer: Medicaid Other | Source: Ambulatory Visit | Attending: Gynecology | Admitting: Gynecology

## 2012-03-21 DIAGNOSIS — N893 Dysplasia of vagina, unspecified: Secondary | ICD-10-CM

## 2012-03-21 DIAGNOSIS — Z01419 Encounter for gynecological examination (general) (routine) without abnormal findings: Secondary | ICD-10-CM | POA: Insufficient documentation

## 2012-03-21 DIAGNOSIS — N898 Other specified noninflammatory disorders of vagina: Secondary | ICD-10-CM

## 2012-03-21 DIAGNOSIS — R350 Frequency of micturition: Secondary | ICD-10-CM

## 2012-03-21 DIAGNOSIS — N890 Mild vaginal dysplasia: Secondary | ICD-10-CM

## 2012-03-21 DIAGNOSIS — N342 Other urethritis: Secondary | ICD-10-CM

## 2012-03-21 LAB — URINALYSIS W MICROSCOPIC + REFLEX CULTURE
Bilirubin Urine: NEGATIVE
Ketones, ur: NEGATIVE mg/dL
Nitrite: NEGATIVE
Protein, ur: NEGATIVE mg/dL
Specific Gravity, Urine: <1.005 — ABNORMAL LOW (ref 1.005–1.030)
Urobilinogen, UA: 0.2 mg/dL (ref 0.0–1.0)

## 2012-03-21 MED ORDER — SULFAMETHOXAZOLE-TMP DS 800-160 MG PO TABS: 1.0000 | ORAL_TABLET | Freq: Two times a day (BID) | ORAL | Status: AC

## 2012-03-21 NOTE — Progress Notes (Signed)
1. Patient ID: Pamela Wall, female   DOB: 1981-10-18, 31 y.o.   MRN: [id number]  Patient presents with several issues: 1. Urinary frequency over the last several days. No pain fever chills or other symptoms but she is going more frequently. 2. History VAIN I on Pap November 2012 with its colposcopic biopsies showing VAIN I. Patient here for 6 month Pap smear follow up. 3. Requested wet smear to make sure BV is gone. She had been treated for this previously she is having no symptoms but just wanted to be screened. 4. Recent evaluation with biopsy for vulvar condyloma with biopsy showing low-grade dysplasia/condyloma here for TCA application.  Exam with Selena Batten chaperone present  External BUS vagina with well-healed biopsy site right upper labia majora. No evidence of external condyloma. Classic condylomatous plaque-like lesion right lower introital opening. 80% TCA applied. Vagina without gross lesions. Pap of cuff done. Digital vaginal exam without palpable abnormalities. Bimanual without masses or tenderness.  Physical Exam  Genitourinary:      Assessment and plan: 1. Urinary frequency. UA has some bacteria but otherwise is unimpressive. Given the symptoms and being Friday am going to cover her with Septra DS one by mouth twice a day x3 days. I will culture the urine. Follow up if symptoms persist or recur. 2. History of VAIN I period type of cuff done. Assuming low-grade him follow up in 6 months for annual exam and Pap smear. 3. Condyloma acuminata/low-grade dysplasia. TCA 80% applied. Follow up in 2-3 weeks for reinspection possible retreatment as needed. 4. Screening for BV. I reviewed with her she is asymptomatic and not having any clinical evidence such as heavy discharge, I do not recommend screening. In an asymptomatic patient I would not treat her.

## 2012-03-21 NOTE — Patient Instructions (Signed)
Follow up in 2 weeks to 3 weeks for reinspection. Take oral antibiotics as prescribed

## 2012-04-04 ENCOUNTER — Ambulatory Visit: Payer: Medicaid Other | Admitting: Gynecology

## 2012-04-11 ENCOUNTER — Ambulatory Visit: Payer: Medicaid Other | Admitting: Gynecology

## 2012-04-25 ENCOUNTER — Encounter: Payer: Self-pay | Admitting: Gynecology

## 2012-04-25 ENCOUNTER — Ambulatory Visit (INDEPENDENT_AMBULATORY_CARE_PROVIDER_SITE_OTHER): Payer: Medicaid Other | Admitting: Gynecology

## 2012-04-25 DIAGNOSIS — IMO0001 Reserved for inherently not codable concepts without codable children: Secondary | ICD-10-CM

## 2012-04-25 DIAGNOSIS — R350 Frequency of micturition: Secondary | ICD-10-CM

## 2012-04-25 DIAGNOSIS — E8940 Asymptomatic postprocedural ovarian failure: Secondary | ICD-10-CM

## 2012-04-25 DIAGNOSIS — A630 Anogenital (venereal) warts: Secondary | ICD-10-CM

## 2012-04-25 LAB — URINALYSIS W MICROSCOPIC + REFLEX CULTURE
Casts: NONE SEEN
Crystals: NONE SEEN
Leukocytes, UA: NEGATIVE
Nitrite: NEGATIVE
Specific Gravity, Urine: <1.005 — ABNORMAL LOW (ref 1.005–1.030)
Urobilinogen, UA: 0.2 mg/dL (ref 0.0–1.0)
pH: 5.5 (ref 5.0–8.0)

## 2012-04-25 MED ORDER — EST ESTROGENS-METHYLTEST 1.25-2.5 MG PO TABS: 1.0000 | ORAL_TABLET | Freq: Every day | ORAL | Status: DC

## 2012-04-25 NOTE — Patient Instructions (Signed)
Follow-up in 2 weeks for reexamination. 

## 2012-04-25 NOTE — Progress Notes (Signed)
Patient presents with 3 issues: 1. Urinary frequency. No dysuria pain or other symptoms. 2. Follow up for condyloma check recently treated with TCA. 3. HRT. Patient has been receiving injectable bioequivalent HRT but is switching clinics and is starting to have hot flushes as she is overdue for her estrogen treatment. She's been getting estrogen/testosterone/progesterone.  Exam with [facility] chaperone External BUS vagina linear condyloma hymenal ring in word at 7:00. No external or other vaginal condyloma noted. Bimanual without palpable vaginal mucosal abnormalities or pelvic masses or tenderness.  Assessment and plan: 1. Urinary frequency. UA questionable low-grade UTI. We'll follow up on culture and treat accordingly. 2. Condyloma. TCA 50% was reapplied to the area. Care was taken to avoid surrounding vaginal mucosa and after application and drying Q-tip application was used to assure no remaining acid before allowing vaginal skin closure. Patient will follow up in several weeks. Possibilities to include excision if this persists despite repeated TCA application. 3. HRT. I again discussed the issues of bioequivalent see with her that I have in the past. I still think estrogen replacement his ward in a young girl status post TAH/BSO. She is also getting benefit from testosterone from a diabetes standpoint and that certainly is reasonable to replace. A mark sure there is any advantage to progesterone replacement and after lengthy discussion I agreed to replace her with Estratest times several months until she can arrange follow up with a mother bioequivalent clinic at her choice. I reviewed the issues of HRT to include the WHI study and the risks of stroke heart attack DVT breast cancer all reviewed. Estratest #33 refills prescribed.

## 2012-04-28 ENCOUNTER — Telehealth: Payer: Self-pay | Admitting: Gynecology

## 2012-04-28 MED ORDER — SULFAMETHOXAZOLE-TRIMETHOPRIM 800-160 MG PO TABS: 1.0000 | ORAL_TABLET | Freq: Two times a day (BID) | ORAL | Status: AC

## 2012-04-28 NOTE — Telephone Encounter (Signed)
Pt informed with the below note. 

## 2012-04-28 NOTE — Telephone Encounter (Signed)
Tell patient that her urinalysis did grow bacteria and I want to treat her with antibiotic. Septra DS 1 by mouth twice a day x3 days.

## 2012-05-14 ENCOUNTER — Telehealth: Payer: Self-pay | Admitting: Gynecology

## 2012-05-14 MED ORDER — FLUCONAZOLE 150 MG PO TABS: 150.0000 mg | ORAL_TABLET | Freq: Once | ORAL | Status: AC

## 2012-05-14 MED ORDER — NITROFURANTOIN MONOHYD MACRO 100 MG PO CAPS: 100.0000 mg | ORAL_CAPSULE | Freq: Two times a day (BID) | ORAL | Status: AC

## 2012-05-14 NOTE — Telephone Encounter (Signed)
Diflucan 150mg x 1

## 2012-05-14 NOTE — Telephone Encounter (Signed)
Pt informed the below note 

## 2012-05-14 NOTE — Telephone Encounter (Signed)
Spoke with pt regarding the below note, pt is requesting diflucan pill as well, she has yeast infection after taking antibody. Please advise

## 2012-05-14 NOTE — Telephone Encounter (Signed)
Tell patient that her urine culture ultimately showed the bacteria was resistant to Septra which we treated her with. I want to treat her with Macrobid 100 mg twice a day x7 days.

## 2012-05-14 NOTE — Telephone Encounter (Signed)
Pt informed with the below note. 

## 2012-05-15 ENCOUNTER — Ambulatory Visit: Payer: Medicaid Other | Admitting: Gynecology

## 2012-05-19 ENCOUNTER — Encounter: Payer: Self-pay | Admitting: Gynecology

## 2012-05-19 ENCOUNTER — Ambulatory Visit (INDEPENDENT_AMBULATORY_CARE_PROVIDER_SITE_OTHER): Payer: Medicaid Other | Admitting: Gynecology

## 2012-05-19 DIAGNOSIS — N900 Mild vulvar dysplasia: Secondary | ICD-10-CM

## 2012-05-19 NOTE — Progress Notes (Signed)
Patient presents for recheck of condyloma/VIN 1. She's had TCA applied twice after biopsy showed condyloma/VIN 1. She does not feel there is any difference in the vaginal condyloma.  Exam with Selena Batten assistant External BUS vagina with persistent large condylomatous area right lateral introital/vaginal opening. Vagina otherwise normal.  Assessment and plan: Persistent large condyloma despite TCA application. Discussed options to include excision versus laser. After lengthy discussion both agree with laser vaporization. The issues of excision with primary closure compromising vaginal width reviewed. She all ready has issues with this historically and required dilators to achieve vaginal width and depth to have intercourse. She is doing this now without difficulty. I did recommend after the procedure that we would use vaginal estrogen supplementation and possible dilatation may be required she understands this. I think laser vaporization would probably cause less compromise than excision with primary closure. The areas past been sampled and showed condylomatous/low-grade changes I don't think it is excisional biopsy is necessary. We will grant make arrangements for this. She'll represent for a preoperative exam just make sure there are no other lesions that we want to vaporize at the same time as well as for a full preoperative consult.

## 2012-05-19 NOTE — Patient Instructions (Signed)
Office will contact you to schedule surgery. 

## 2012-05-20 ENCOUNTER — Telehealth: Payer: Self-pay | Admitting: Gynecology

## 2012-05-20 NOTE — Telephone Encounter (Signed)
I called patient to schedule outpatient surgery and she had several questions.  1.  Patient said you told her IV sedation will be acceptable. She does not want general anesthesia. OK?  2.  Patient said you told her you plan to do excision and send tissue for biopsy as well as laser surgery. She wondered what her recovery time will be in terms of being able to wear clothes, sit, etc. so she can plan how long she needs to be out of work.  3.  She sees a Pain Management Specialist.  She wondered if post operative medication is something you are comfortable prescribing for her or should she consult with her pain management specialist?

## 2012-05-20 NOTE — Telephone Encounter (Signed)
#  1 IV sedation would be okay per anesthesia okay. #2 expect minimal pain. This is mostly inside vaginal laser which usually does not cause a lot of pain.  Return to work next day #3 I would check with her pain management specialist but I do not anticipate a lot of help the pain. More than likely Motrin 600 mg/800 mg doses would do.

## 2012-05-29 ENCOUNTER — Telehealth: Payer: Self-pay | Admitting: Gynecology

## 2012-05-29 NOTE — Telephone Encounter (Signed)
Left message for patient to call me back. 

## 2012-05-29 NOTE — Telephone Encounter (Signed)
Left message for patient to call to schedule surgery

## 2012-06-10 ENCOUNTER — Telehealth: Payer: Self-pay | Admitting: Gynecology

## 2012-06-10 NOTE — Telephone Encounter (Signed)
Patient called today. She apologized for not having called sooner. Had been on vacation and then been sick.  We scheduled her surgery for the week she requested as she will be on "hiatus" from work.  She is scheduled Monday, August 19 7:30am at [facility].  She will consult with Dr. Velvet Bathe on Friday August 16 at 3:30pm.  She will expect [facility] to call her with instructions.

## 2012-06-10 NOTE — Telephone Encounter (Signed)
error 

## 2012-06-11 ENCOUNTER — Encounter (HOSPITAL_COMMUNITY): Payer: Self-pay | Admitting: *Deleted

## 2012-06-17 ENCOUNTER — Encounter (HOSPITAL_COMMUNITY): Payer: Self-pay | Admitting: Pharmacist

## 2012-06-20 ENCOUNTER — Encounter: Payer: Self-pay | Admitting: Gynecology

## 2012-06-20 ENCOUNTER — Ambulatory Visit (INDEPENDENT_AMBULATORY_CARE_PROVIDER_SITE_OTHER): Payer: Medicaid Other | Admitting: Gynecology

## 2012-06-20 DIAGNOSIS — N893 Dysplasia of vagina, unspecified: Secondary | ICD-10-CM

## 2012-06-20 DIAGNOSIS — N898 Other specified noninflammatory disorders of vagina: Secondary | ICD-10-CM

## 2012-06-20 NOTE — Patient Instructions (Addendum)
Followup for surgery as scheduled. 

## 2012-06-20 NOTE — Progress Notes (Signed)
Patient ID: Pamela Wall, female   DOB: 1981/09/28, 31 y.o.   MRN: [id number] LILIBETH OPIE 12/28/1980 [id number]   Preoperative consult  Chief complaint: persistent condyloma/VAIN I  History of present illness: 31 y.o. G0P0 with persistent vaginal condyloma/VAIN I despite several applications of TCA 80%. Lesions are large enough to preclude office excision and patient is admitted for laser vaporization of visible condyloma.  Past medical history,surgical history, medications, allergies, family history and social history were all reviewed and documented in the EPIC chart. ROS:  Was performed and pertinent positives and negatives are included in the history of present illness.  Exam: General: well developed, well nourished female, no acute distress HEENT: normal  Lungs: clear to auscultation without wheezing, rales or rhonchi  Cardiac: regular rate without rubs, murmurs or gallops  Abdomen: soft, nontender without masses, guarding, rebound, organomegaly  Pelvic: external bus vagina: large flat condyloma 7:00 position introital opening. Small condyloma one finger breath into the vagina at the 1:00 position. Vaginal opening restricted to admit one finger.  Physical Exam  Genitourinary:         Adnexa: without masses or tenderness  Rectovaginal exam within normal limits. No evidence of internal palpable condyloma.    Assessment/Plan:  31 y.o. G0P0 history of endodermal sinus tumor status post TAH/BSO upper vaginectomy  and radiation therapy with restricted vaginal depth and width. Patient initially had to use dilators before initiating intercourse but now can have intercourse. Patient has persistent vaginal condyloma relatively large flat at the 6:00 position at the junction of the hymenal ring and vagina and small frond-like at the 1:00 position 1 fingerbreadth within the vaginal canal. These areas have been sampled and are consistent with condyloma/low-grade dysplasia. Several  applications of TCA 80% have been ineffective. Given the restriction of her vagina I was fearful that excision with primary closure would lead to further tightening and that laser would be the best option to hopefully maintain her vaginal width. I did review with her realistically that she may require some dilatation afterwards with healing and she understands and accepts this possibility. Also reviewed the potential with healing of discomfort not only immediately but long-term with intercourse and the risk of long-term dyspareunia discussed, which already is an off and on issue given the restriction in width and depth.  She understands the HPV relationship and that I am not guaranteeing that she will never have a recurrence of her condyloma and that we are not eradicating the virus.  I also discussed that we will reinspect her vulva after acetic acid cleanse and that if there are subtle external condyloma that we will also laser these that may lead to a more extensive procedure and postoperatively she would have more discomfort with the healing externally. I reviewed the need for Silvadene cream and sitz baths particularly if we do more extensive external laser. She'll continue on her oral estrogen and I recommended after several days postop to initiate vaginal estrogen to accelerate vaginal healing and she already has this at home. The risks of infection requiring reoperation to drain abscess formations  prolonged antibiotics reviewed as well as the risk of bleeding and the need to address this postoperatively. The risk of damage to surrounding tissues including bladder urethra vagina rectum necessitating major exploratory reparative surgeries discussed. Patient's questions were answered to her satisfaction she is ready to proceed with surgery.    Dara Lords MD, 4:28 PM 06/20/2012

## 2012-06-20 NOTE — H&P (Signed)
  Chief complaint: persistent condyloma/VAIN I  History of present illness: 31 y.o. G0P0 with persistent vaginal condyloma/VAIN I despite several applications of TCA 80%. Lesions are large enough to preclude office excision and patient is admitted for laser vaporization of visible condyloma.  Past medical history,surgical history, medications, allergies, family history and social history were all reviewed and documented in the EPIC chart. ROS:  Was performed and pertinent positives and negatives are included in the history of present illness.  Exam: General: well developed, well nourished female, no acute distress HEENT: normal  Lungs: clear to auscultation without wheezing, rales or rhonchi  Cardiac: regular rate without rubs, murmurs or gallops  Abdomen: soft, nontender without masses, guarding, rebound, organomegaly  Pelvic: external bus vagina: large flat condyloma 7:00 position introital opening. Small condyloma one finger breath into the vagina at the 1:00 position. Vaginal opening restricted to admit one finger.  Physical Exam  Genitourinary:     :     Adnexa: without masses or tenderness  Rectovaginal exam within normal limits. No evidence of internal palpable condyloma.    Assessment/Plan:  31 y.o. G0P0 history of endodermal sinus tumor status post TAH/BSO upper vaginectomy  and radiation therapy with restricted vaginal depth and width. Patient initially had to use dilators before initiating intercourse but now can have intercourse. Patient has persistent vaginal condyloma relatively large flat at the 6:00 position at the junction of the hymenal ring and vagina and small frond-like at the 1:00 position 1 fingerbreadth within the vaginal canal. These areas have been sampled and are consistent with condyloma/low-grade dysplasia. Several applications of TCA 80% have been ineffective. Given the restriction of her vagina I was fearful that excision with primary closure would lead to  further tightening and that laser would be the best option to hopefully maintain her vaginal width. I did review with her realistically that she may require some dilatation afterwards with healing and she understands and accepts this possibility. Also reviewed the potential with healing of discomfort not only immediately but long-term with intercourse and the risk of long-term dyspareunia discussed, which already is an off and on issue given the restriction in width and depth.  She understands the HPV relationship and that I am not guaranteeing that she will never have a recurrence of her condyloma and that we are not eradicating the virus.  I also discussed that we will reinspect her vulva after acetic acid cleanse and that if there are subtle external condyloma that we will also laser these that may lead to a more extensive procedure and postoperatively she would have more discomfort with the healing externally. I reviewed the need for Silvadene cream and sitz baths particularly if we do more extensive external laser. She'll continue on her oral estrogen and I recommended after several days postop to initiate vaginal estrogen to accelerate vaginal healing and she already has this at home. The risks of infection requiring reoperation to drain abscess formations  prolonged antibiotics reviewed as well as the risk of bleeding and the need to address this postoperatively. The risk of damage to surrounding tissues including bladder urethra vagina rectum necessitating major exploratory reparative surgeries discussed. Patient's questions were answered to her satisfaction she is ready to proceed with surgery.

## 2012-06-22 MED ORDER — DEXTROSE 5 % IV SOLN
2.0000 g | INTRAVENOUS | Status: AC
  Administered 2012-06-23: 2 g via INTRAVENOUS
  Filled 2012-06-22: qty 2

## 2012-06-23 ENCOUNTER — Telehealth: Payer: Self-pay | Admitting: Gynecology

## 2012-06-23 ENCOUNTER — Encounter (HOSPITAL_COMMUNITY): Payer: Self-pay | Admitting: Anesthesiology

## 2012-06-23 ENCOUNTER — Encounter (HOSPITAL_COMMUNITY)
Admission: RE | Disposition: A | Discharge status: Home / Self Care | Payer: Self-pay | Source: Ambulatory Visit | Attending: Gynecology

## 2012-06-23 ENCOUNTER — Ambulatory Visit (HOSPITAL_COMMUNITY)
Admission: RE | Admit: 2012-06-23 | Discharge: 2012-06-23 | Disposition: A | Discharge status: Home / Self Care | Payer: Medicaid Other | Source: Ambulatory Visit | Attending: Gynecology | Admitting: Gynecology

## 2012-06-23 ENCOUNTER — Ambulatory Visit (HOSPITAL_COMMUNITY): Payer: Medicaid Other | Admitting: Anesthesiology

## 2012-06-23 DIAGNOSIS — N893 Dysplasia of vagina, unspecified: Secondary | ICD-10-CM | POA: Insufficient documentation

## 2012-06-23 DIAGNOSIS — A630 Anogenital (venereal) warts: Secondary | ICD-10-CM | POA: Insufficient documentation

## 2012-06-23 HISTORY — DX: Malignant (primary) neoplasm, unspecified: C80.1

## 2012-06-23 HISTORY — DX: Unspecified asthma, uncomplicated: J45.909

## 2012-06-23 HISTORY — DX: Depression, unspecified: F32.A

## 2012-06-23 HISTORY — DX: Major depressive disorder, single episode, unspecified: F32.9

## 2012-06-23 HISTORY — DX: Dorsalgia, unspecified: M54.9

## 2012-06-23 LAB — CBC
HCT: 37.8 % (ref 36.0–46.0)
Hemoglobin: 13 g/dL (ref 12.0–15.0)
MCV: 92.4 fL (ref 78.0–100.0)
RBC: 4.09 MIL/uL (ref 3.87–5.11)
WBC: 8.4 10*3/uL (ref 4.0–10.5)

## 2012-06-23 SURGERY — ABLATION, CONDYLOMA, USING LASER
Anesthesia: General | Site: Cervix | Wound class: Clean Contaminated

## 2012-06-23 MED ORDER — GLYCOPYRROLATE 0.2 MG/ML IJ SOLN
INTRAMUSCULAR | Status: AC
  Filled 2012-06-23: qty 1

## 2012-06-23 MED ORDER — ACETIC ACID 4% SOLUTION
Status: DC | PRN
  Administered 2012-06-23: 1 via TOPICAL

## 2012-06-23 MED ORDER — FENTANYL CITRATE 0.05 MG/ML IJ SOLN
INTRAMUSCULAR | Status: AC
  Filled 2012-06-23: qty 2

## 2012-06-23 MED ORDER — LIDOCAINE HCL (CARDIAC) 20 MG/ML IV SOLN
INTRAVENOUS | Status: DC | PRN
  Administered 2012-06-23: 60 mg via INTRAVENOUS

## 2012-06-23 MED ORDER — KETOROLAC TROMETHAMINE 30 MG/ML IJ SOLN: 15.0000 mg | Freq: Once | INTRAMUSCULAR | Status: DC | PRN

## 2012-06-23 MED ORDER — FENTANYL CITRATE 0.05 MG/ML IJ SOLN
INTRAMUSCULAR | Status: DC | PRN
  Administered 2012-06-23 (×2): 100 ug via INTRAVENOUS

## 2012-06-23 MED ORDER — MEPERIDINE HCL 25 MG/ML IJ SOLN: 6.2500 mg | INTRAMUSCULAR | Status: DC | PRN

## 2012-06-23 MED ORDER — KETOROLAC TROMETHAMINE 30 MG/ML IJ SOLN
INTRAMUSCULAR | Status: AC
  Filled 2012-06-23: qty 1

## 2012-06-23 MED ORDER — SILVER NITRATE-POT NITRATE 75-25 % EX MISC
CUTANEOUS | Status: DC | PRN
  Administered 2012-06-23: 2

## 2012-06-23 MED ORDER — DEXAMETHASONE SODIUM PHOSPHATE 4 MG/ML IJ SOLN
INTRAMUSCULAR | Status: DC | PRN
  Administered 2012-06-23: 10 mg via INTRAVENOUS

## 2012-06-23 MED ORDER — SILVER SULFADIAZINE 1 % EX CREA
TOPICAL_CREAM | Freq: Two times a day (BID) | CUTANEOUS | Status: DC
  Filled 2012-06-23: qty 20

## 2012-06-23 MED ORDER — SILVER SULFADIAZINE 1 % EX CREA
TOPICAL_CREAM | CUTANEOUS | Status: DC | PRN
  Administered 2012-06-23: 1 via TOPICAL

## 2012-06-23 MED ORDER — PROPOFOL 10 MG/ML IV EMUL
INTRAVENOUS | Status: DC | PRN
  Administered 2012-06-23: 200 mg via INTRAVENOUS

## 2012-06-23 MED ORDER — HYDROMORPHONE HCL PF 1 MG/ML IJ SOLN: 0.2500 mg | INTRAMUSCULAR | Status: DC | PRN

## 2012-06-23 MED ORDER — BUPIVACAINE HCL (PF) 0.25 % IJ SOLN
INTRAMUSCULAR | Status: DC | PRN
  Administered 2012-06-23: 5 mL

## 2012-06-23 MED ORDER — KETOROLAC TROMETHAMINE 30 MG/ML IJ SOLN
INTRAMUSCULAR | Status: DC | PRN
  Administered 2012-06-23: 30 mg via INTRAVENOUS

## 2012-06-23 MED ORDER — SILVER NITRATE-POT NITRATE 75-25 % EX MISC
CUTANEOUS | Status: AC
  Filled 2012-06-23: qty 1

## 2012-06-23 MED ORDER — LIDOCAINE HCL (CARDIAC) 20 MG/ML IV SOLN
INTRAVENOUS | Status: AC
  Filled 2012-06-23: qty 5

## 2012-06-23 MED ORDER — DEXAMETHASONE SODIUM PHOSPHATE 10 MG/ML IJ SOLN
INTRAMUSCULAR | Status: AC
  Filled 2012-06-23: qty 1

## 2012-06-23 MED ORDER — BUPIVACAINE HCL (PF) 0.25 % IJ SOLN
INTRAMUSCULAR | Status: AC
  Filled 2012-06-23: qty 30

## 2012-06-23 MED ORDER — SILVER SULFADIAZINE 1 % EX CREA
TOPICAL_CREAM | CUTANEOUS | Status: AC
  Filled 2012-06-23: qty 50

## 2012-06-23 MED ORDER — SILVER SULFADIAZINE 1 % EX CREA: TOPICAL_CREAM | Freq: Two times a day (BID) | CUTANEOUS | Status: AC

## 2012-06-23 MED ORDER — SCOPOLAMINE 1 MG/3DAYS TD PT72
MEDICATED_PATCH | TRANSDERMAL | Status: AC
  Administered 2012-06-23: 1.5 mg
  Filled 2012-06-23: qty 1

## 2012-06-23 MED ORDER — MIDAZOLAM HCL 5 MG/5ML IJ SOLN
INTRAMUSCULAR | Status: DC | PRN
  Administered 2012-06-23: 2 mg via INTRAVENOUS

## 2012-06-23 MED ORDER — ONDANSETRON HCL 4 MG/2ML IJ SOLN
INTRAMUSCULAR | Status: DC | PRN
  Administered 2012-06-23: 4 mg via INTRAVENOUS

## 2012-06-23 MED ORDER — KETOROLAC TROMETHAMINE 60 MG/2ML IM SOLN
INTRAMUSCULAR | Status: DC | PRN
  Administered 2012-06-23: 30 mg via INTRAMUSCULAR

## 2012-06-23 MED ORDER — PROPOFOL 10 MG/ML IV EMUL
INTRAVENOUS | Status: AC
  Filled 2012-06-23: qty 20

## 2012-06-23 MED ORDER — PROMETHAZINE HCL 25 MG RE SUPP: 25.0000 mg | Freq: Once | RECTAL | Status: DC | PRN

## 2012-06-23 MED ORDER — ONDANSETRON HCL 4 MG/2ML IJ SOLN
INTRAMUSCULAR | Status: AC
  Filled 2012-06-23: qty 2

## 2012-06-23 MED ORDER — DEXTROSE-NACL 5-0.9 % IV SOLN: INTRAVENOUS | Status: DC

## 2012-06-23 MED ORDER — MIDAZOLAM HCL 2 MG/2ML IJ SOLN
INTRAMUSCULAR | Status: AC
  Filled 2012-06-23: qty 2

## 2012-06-23 MED ORDER — LACTATED RINGERS IV SOLN
INTRAVENOUS | Status: DC
  Administered 2012-06-23: 07:00:00 via INTRAVENOUS

## 2012-06-23 SURGICAL SUPPLY — 20 items
APPLICATOR COTTON TIP 6IN STRL (MISCELLANEOUS) ×2 IMPLANT
CLOTH BEACON ORANGE TIMEOUT ST (SAFETY) ×2 IMPLANT
CONTAINER PREFILL 10% NBF 60ML (FORM) ×1 IMPLANT
DEPRESSOR TONGUE BLADE WOOD (MISCELLANEOUS) ×2 IMPLANT
EVACUATOR PREFILTER SMOKE (MISCELLANEOUS) ×2 IMPLANT
GAUZE SPONGE 4X4 16PLY XRAY LF (GAUZE/BANDAGES/DRESSINGS) IMPLANT
GLOVE BIO SURGEON STRL SZ7.5 (GLOVE) ×4 IMPLANT
GOWN PREVENTION PLUS LG XLONG (DISPOSABLE) ×4 IMPLANT
HOSE NS SMOKE EVAC 7/8 X6 (MISCELLANEOUS) ×2 IMPLANT
NDL SPNL 22GX3.5 QUINCKE BK (NEEDLE) ×1 IMPLANT
NEEDLE SPNL 22GX3.5 QUINCKE BK (NEEDLE) ×2 IMPLANT
NS IRRIG 1000ML POUR BTL (IV SOLUTION) ×2 IMPLANT
PACK VAGINAL MINOR WOMEN LF (CUSTOM PROCEDURE TRAY) ×2 IMPLANT
REDUCER FITTING SMOKE EVAC (MISCELLANEOUS) ×2 IMPLANT
SCOPETTES 8  STERILE (MISCELLANEOUS) ×1
SCOPETTES 8 STERILE (MISCELLANEOUS) ×2 IMPLANT
SYR CONTROL 10ML LL (SYRINGE) ×2 IMPLANT
TOWEL OR 17X24 6PK STRL BLUE (TOWEL DISPOSABLE) ×4 IMPLANT
TUBING SMOKE EVAC HOSE ADAPTER (MISCELLANEOUS) ×2 IMPLANT
WATER STERILE IRR 1000ML POUR (IV SOLUTION) ×3 IMPLANT

## 2012-06-23 NOTE — Addendum Note (Signed)
Addendum  created 06/23/12 1001 by Juvencio Verdi O Tobey Lippard, CRNA   Modules edited:Anesthesia Responsible Staff    

## 2012-06-23 NOTE — H&P (Signed)
  The patient was examined.  I reviewed the proposed surgery and consent form with the patient.  The dictated history and physical is current and accurate and all questions were answered. The patient is ready to proceed with surgery and has a realistic understanding and expectation for the outcome.   Dara Lords MD, 7:11 AM 06/23/2012

## 2012-06-23 NOTE — Anesthesia Preprocedure Evaluation (Signed)
Anesthesia Evaluation  Patient identified by MRN, date of birth, ID band Patient awake    Reviewed: Allergy & Precautions, H&P , NPO status , Patient's Chart, lab work & pertinent test results  Airway Mallampati: I TM Distance: >3 FB Neck ROM: full    Dental No notable dental hx. (+) Teeth Intact   Pulmonary neg pulmonary ROS,    Pulmonary exam normal       Cardiovascular negative cardio ROS      Neuro/Psych PSYCHIATRIC DISORDERS Depression negative neurological ROS     GI/Hepatic negative GI ROS, Neg liver ROS,   Endo/Other  negative endocrine ROS  Renal/GU negative Renal ROS  negative genitourinary   Musculoskeletal negative musculoskeletal ROS (+)   Abdominal Normal abdominal exam  (+)   Peds negative pediatric ROS (+)  Hematology negative hematology ROS (+)   Anesthesia Other Findings   Reproductive/Obstetrics negative OB ROS                           Anesthesia Physical Anesthesia Plan  ASA: II  Anesthesia Plan: General   Post-op Pain Management:    Induction: Intravenous  Airway Management Planned: LMA  Additional Equipment:   Intra-op Plan:   Post-operative Plan:   Informed Consent: I have reviewed the patients History and Physical, chart, labs and discussed the procedure including the risks, benefits and alternatives for the proposed anesthesia with the patient or authorized representative who has indicated his/her understanding and acceptance.     Plan Discussed with: CRNA and Surgeon  Anesthesia Plan Comments:         Anesthesia Quick Evaluation  

## 2012-06-23 NOTE — Anesthesia Procedure Notes (Signed)
Procedure Name: LMA Insertion Date/Time: 06/23/2012 7:35 AM Performed by: Graciela Husbands Pre-anesthesia Checklist: Suction available, Emergency Drugs available, Timeout performed, Patient identified and Patient being monitored Patient Re-evaluated:Patient Re-evaluated prior to inductionOxygen Delivery Method: Circle system utilized Preoxygenation: Pre-oxygenation with 100% oxygen Intubation Type: IV induction Ventilation: Mask ventilation without difficulty LMA: LMA inserted LMA Size: 4.0 Number of attempts: 1 Placement Confirmation: positive ETCO2 and breath sounds checked- equal and bilateral Tube secured with: Tape Dental Injury: Teeth and Oropharynx as per pre-operative assessment

## 2012-06-23 NOTE — Transfer of Care (Signed)
Immediate Anesthesia Transfer of Care Note  Patient: Pamela Wall  Procedure(s) Performed: Procedure(s) (LRB): LASER ABLATION CONDOLAMATA (N/A)  Patient Location: PACU  Anesthesia Type: General  Level of Consciousness: awake, alert  and oriented  Airway & Oxygen Therapy: Patient Spontanous Breathing and Patient connected to nasal cannula oxygen  Post-op Assessment: Report given to PACU RN and Post -op Vital signs reviewed and stable  Post vital signs: Reviewed and stable  Complications: No apparent anesthesia complications

## 2012-06-23 NOTE — Anesthesia Postprocedure Evaluation (Signed)
Anesthesia Post Note  Patient: Pamela Wall  Procedure(s) Performed: Procedure(s) (LRB): LASER ABLATION CONDOLAMATA (N/A)  Anesthesia type: General  Patient location: PACU  Post pain: Pain level controlled  Post assessment: Post-op Vital signs reviewed  Last Vitals:  Filed Vitals:   06/23/12 0913  BP:   Pulse: 70  Temp: 36.9 C  Resp: 16    Post vital signs: Reviewed  Level of consciousness: sedated  Complications: No apparent anesthesia complications

## 2012-06-23 NOTE — Telephone Encounter (Signed)
Spoke with pt husband and her will tell pt the below.

## 2012-06-23 NOTE — Addendum Note (Signed)
Addendum  created 06/23/12 1001 by Graciela Husbands, CRNA   Modules edited:Anesthesia Responsible Staff

## 2012-06-23 NOTE — Telephone Encounter (Signed)
Tell patient that I prescribed the Silvadene for her to be picked up at her pharmacy.  Apparently they forgot to give it to her when she left the hospital..

## 2012-06-23 NOTE — Op Note (Signed)
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 Classic condyloma 1:00 position 2 fingerbreadths within the vagina.  No other external or internal condyloma visualized after acetic acid cleanse. Bimanual without masses or tenderness rectal exam normal.     Procedure:  Patient was taken to the operating room, underwent general anesthesia, was placed in the low dorsal lithotomy position, was draped in the usual fashion with additional laser precautions using moist towels, EUA performed and the vulva and vagina cleansed with 5% acetic acid. A timeout was performed by the surgical team.  The carbon dioxide laser was then attached to the colposcope and after aligning the laser and verified proper functioning with a tongue blade, colposcopy of the vulva and vagina were performed with findings noted above. A representative biopsy of the 7:00 flat condyloma was taken and sent to pathology.  Using the carbon dioxide laser 10 W continuous power all visible lesions were then vaporized to the level of the surrounding mucosa. Silvadene cream was applied to the 7:00 condyloma and the underlying subcutaneous tissues infiltrated using 0.25% Marcaine. Hemostasis was visualized at the laser sites. The patient was placed in the supine position, received intraoperative Toradol, was awakened without difficulty and taken to the recovery room in good condition having tolerated the procedure well.    Dara Lords MD, 8:52 AM 06/23/2012

## 2012-06-26 ENCOUNTER — Encounter (HOSPITAL_COMMUNITY): Payer: Self-pay

## 2012-07-09 ENCOUNTER — Ambulatory Visit: Payer: Medicaid Other | Admitting: Gynecology

## 2012-07-11 ENCOUNTER — Ambulatory Visit: Payer: Medicaid Other | Admitting: Gynecology

## 2012-07-18 ENCOUNTER — Ambulatory Visit: Payer: Medicaid Other | Admitting: Gynecology

## 2012-09-19 ENCOUNTER — Other Ambulatory Visit: Payer: Self-pay | Admitting: Women's Health

## 2012-09-19 DIAGNOSIS — E8940 Asymptomatic postprocedural ovarian failure: Secondary | ICD-10-CM

## 2012-09-19 MED ORDER — EST ESTROGENS-METHYLTEST 1.25-2.5 MG PO TABS: 1.0000 | ORAL_TABLET | Freq: Every day | ORAL | Status: DC

## 2012-09-22 ENCOUNTER — Encounter: Payer: Medicaid Other | Admitting: Women's Health

## 2013-11-16 ENCOUNTER — Telehealth: Payer: Self-pay | Admitting: Family Medicine

## 2013-11-16 NOTE — Telephone Encounter (Signed)
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

## 2013-11-24 NOTE — Telephone Encounter (Signed)
Spoke to pt and it has been since 2012 since we seen her and she can bring the form by and we can fill it out for 2012 for a form fee or she can schedule appt.

## 2014-01-25 ENCOUNTER — Telehealth: Payer: Self-pay | Admitting: *Deleted

## 2014-01-25 MED ORDER — FLUCONAZOLE 150 MG PO TABS: 150.0000 mg | ORAL_TABLET | Freq: Once | ORAL | Status: DC

## 2014-01-25 NOTE — Telephone Encounter (Signed)
Pt called c/o yeast infection itching white discharge took home yeast infection test to confirm. Pt is requesting diflucan, last seen in august 2013, unable to make OV due to insurance. Please advise

## 2014-01-25 NOTE — Telephone Encounter (Signed)
rx sent, pt informed.  

## 2014-01-25 NOTE — Telephone Encounter (Signed)
Okay for Diflucan 150 mg times one dose

## 2014-02-08 ENCOUNTER — Ambulatory Visit (INDEPENDENT_AMBULATORY_CARE_PROVIDER_SITE_OTHER): Payer: Medicaid Other | Admitting: Physician Assistant

## 2014-02-08 ENCOUNTER — Encounter: Payer: Self-pay | Admitting: Physician Assistant

## 2014-02-08 VITALS — BP 152/94 | HR 72 | Temp 98.3°F | Resp 18 | Ht 60.0 in | Wt 101.0 lb

## 2014-02-08 DIAGNOSIS — Z7189 Other specified counseling: Secondary | ICD-10-CM

## 2014-02-08 DIAGNOSIS — F17200 Nicotine dependence, unspecified, uncomplicated: Secondary | ICD-10-CM

## 2014-02-08 DIAGNOSIS — Z716 Tobacco abuse counseling: Secondary | ICD-10-CM

## 2014-02-08 DIAGNOSIS — K819 Cholecystitis, unspecified: Secondary | ICD-10-CM

## 2014-02-08 MED ORDER — VARENICLINE TARTRATE 1 MG PO TABS: 1.0000 mg | ORAL_TABLET | Freq: Two times a day (BID) | ORAL | Status: DC

## 2014-02-08 MED ORDER — VARENICLINE TARTRATE 0.5 MG PO TABS: 0.5000 mg | ORAL_TABLET | Freq: Two times a day (BID) | ORAL | Status: DC

## 2014-02-08 NOTE — Progress Notes (Signed)
Patient ID: Pamela Wall MRN: [id number], DOB: [birth year], 33 y.o. Date of Encounter: 02/08/2014, 11:39 AM    Chief Complaint:  Chief Complaint  Patient presents with  . ED follow up gall bladder    wants to stop smoking,  seen at [facility]     HPI: 33 y.o. year old female reports that she went to [facility] emergency room recently with abdominal pain.  Says they did multiple tests including CT scans, ultrasounds, et Ronney Asters. Says that these showed multiple gallstones as well as a stone in the duct. She states that they wanted to keep her there and do the surgery at that time but she has small kids at home and needed to take care of them prior to having the surgery. While she was in the ER the ER doctor spoke to Dr.De Cornelia Copa who is a Psychologist, sport and exercise. Says she was told to followup with him. However when she called his office she was told that she had to have her referral from [place] secondary to insurance. Says today was the first appt we had available to see her.  Says that she is continuing to have pain. Having no fevers and no chills.  She is also wanting prescription for Chantix to help with smoking cessation. Says she used this in the past and it worked well for her. Was able to stay quit for 2 years.Started Back to smoking 8 months ago. Currently smoking about 1 pack every 3-4 days.  Says her blood pressure is up secondary to her pain. Says blood pressure is usually normal but that was high in the ER when her pain was high.     Home Meds: See attached medication section for any medications that were entered at today's visit. The computer does not put those onto this list.The following list is a list of meds entered prior to today's visit.   Current Outpatient Prescriptions on File Prior to Visit  Medication Sig Dispense Refill  . Loperamide HCl (IMODIUM PO) Take 2-4 mg by mouth 3 (three) times daily. 2 caps in am, 1 cap at lunch and at bedtime      . estrogen-methylTESTOSTERone (ESTRATEST)  1.25-2.5 MG per tablet Take 1 tablet by mouth daily.  30 tablet  3  . fluconazole (DIFLUCAN) 150 MG tablet Take 1 tablet (150 mg total) by mouth once.  1 tablet  0  . oxyCODONE (ROXICODONE) 15 MG immediate release tablet Take 15 mg by mouth every 4 (four) hours while awake. Pt takes 5 times a day       No current facility-administered medications on file prior to visit.    Allergies: No Known Allergies    Review of Systems: See HPI for pertinent ROS. All other ROS negative.    Physical Exam: Blood pressure 152/94, pulse 72, temperature 98.3 F (36.8 C), temperature source Oral, resp. rate 18, height 5' (1.524 m), weight 101 lb (45.813 kg), last menstrual period 08/15/1984., Body mass index is 19.73 kg/(m^2). General:  WNWD WF. Appears in no acute distress. Lungs: Clear bilaterally to auscultation without wheezes, rales, or rhonchi. Breathing is unlabored. Heart: Regular rhythm. No murmurs, rubs, or gallops. Abdomen: Soft,  non-distended with normoactive bowel sounds. No hepatomegaly.  No obvious abdominal masses.Positive tenderness upper abdomen, even on left side. Msk:  Strength and tone normal for age. Extremities/Skin: Warm and dry. Neuro: Alert and oriented X 3. Moves all extremities spontaneously. Gait is normal. CNII-XII grossly in tact. Psych:  Responds to questions appropriately with a  normal affect.     ASSESSMENT AND PLAN:  33 y.o. year old female with  1. Cholecystitis - Ambulatory referral to General Surgery i spoke to  the referral scheduler. She is calling Dr. Worthy Keeler office now in making this appointment within the next 48 hours. Pt is instructed that if symptoms worsen in the meantime and she needs to go to the emergency room.   2. Smoker Does possible adverse effects of this medication including but not limited to change in mood or behavior. She developed this or any other side effects that she is to stop the medication and call if immediately. She has used this  medication in the past and had no adverse effects but did have good results with that. Knows e the starting dose pack and then to the continuing dose pack after work. - varenicline (CHANTIX) 0.5 MG tablet; Take 1 tablet (0.5 mg total) by mouth 2 (two) times daily.  Dispense: 60 tablet; Refill: 0 - varenicline (CHANTIX CONTINUING MONTH PAK) 1 MG tablet; Take 1 tablet (1 mg total) by mouth 2 (two) times daily.  Dispense: 60 tablet; Refill: 3  3. Encounter for smoking cessation counseling - varenicline (CHANTIX) 0.5 MG tablet; Take 1 tablet (0.5 mg total) by mouth 2 (two) times daily.  Dispense: 60 tablet; Refill: 0 - varenicline (CHANTIX CONTINUING MONTH PAK) 1 MG tablet; Take 1 tablet (1 mg total) by mouth 2 (two) times daily.  Dispense: 60 tablet; Refill: 3   Signed, [address] [place], [place], [facility] 02/08/2014 11:39 AM

## 2014-02-12 HISTORY — PX: CHOLECYSTECTOMY OPEN: SUR202

## 2014-02-18 ENCOUNTER — Ambulatory Visit (HOSPITAL_COMMUNITY): Payer: Medicaid Other

## 2014-02-18 ENCOUNTER — Ambulatory Visit (HOSPITAL_COMMUNITY)
Admission: RE | Admit: 2014-02-18 | Discharge: 2014-02-18 | Disposition: A | Discharge status: Home / Self Care | Payer: Medicaid Other | Source: Ambulatory Visit | Attending: Internal Medicine | Admitting: Internal Medicine

## 2014-02-18 ENCOUNTER — Encounter (HOSPITAL_COMMUNITY): Payer: Self-pay | Admitting: *Deleted

## 2014-02-18 ENCOUNTER — Encounter (HOSPITAL_COMMUNITY)
Admission: RE | Disposition: A | Discharge status: Home / Self Care | Payer: Self-pay | Source: Ambulatory Visit | Attending: Internal Medicine

## 2014-02-18 ENCOUNTER — Encounter (HOSPITAL_COMMUNITY): Payer: Medicaid Other | Admitting: Anesthesiology

## 2014-02-18 ENCOUNTER — Ambulatory Visit (HOSPITAL_COMMUNITY): Payer: Medicaid Other | Admitting: Anesthesiology

## 2014-02-18 DIAGNOSIS — R7402 Elevation of levels of lactic acid dehydrogenase (LDH): Secondary | ICD-10-CM | POA: Insufficient documentation

## 2014-02-18 DIAGNOSIS — F17200 Nicotine dependence, unspecified, uncomplicated: Secondary | ICD-10-CM | POA: Insufficient documentation

## 2014-02-18 DIAGNOSIS — R740 Nonspecific elevation of levels of transaminase and lactic acid dehydrogenase [LDH]: Secondary | ICD-10-CM

## 2014-02-18 DIAGNOSIS — K838 Other specified diseases of biliary tract: Secondary | ICD-10-CM | POA: Insufficient documentation

## 2014-02-18 DIAGNOSIS — R7401 Elevation of levels of liver transaminase levels: Secondary | ICD-10-CM | POA: Insufficient documentation

## 2014-02-18 HISTORY — PX: ERCP: SHX5425

## 2014-02-18 SURGERY — ERCP, WITH INTERVENTION IF INDICATED
Anesthesia: General

## 2014-02-18 MED ORDER — SUCCINYLCHOLINE CHLORIDE 20 MG/ML IJ SOLN
INTRAMUSCULAR | Status: DC | PRN
  Administered 2014-02-18: 140 mg via INTRAVENOUS

## 2014-02-18 MED ORDER — FENTANYL CITRATE 0.05 MG/ML IJ SOLN
INTRAMUSCULAR | Status: AC
  Filled 2014-02-18: qty 2

## 2014-02-18 MED ORDER — ONDANSETRON HCL 4 MG/2ML IJ SOLN
INTRAMUSCULAR | Status: AC
  Filled 2014-02-18: qty 2

## 2014-02-18 MED ORDER — INDOMETHACIN 50 MG RE SUPP: 50.0000 mg | Freq: Once | RECTAL | Status: DC

## 2014-02-18 MED ORDER — GLYCOPYRROLATE 0.2 MG/ML IJ SOLN
0.2000 mg | Freq: Once | INTRAMUSCULAR | Status: AC
  Administered 2014-02-18: 0.2 mg via INTRAVENOUS

## 2014-02-18 MED ORDER — ONDANSETRON HCL 4 MG/2ML IJ SOLN: 4.0000 mg | Freq: Once | INTRAMUSCULAR | Status: AC | PRN

## 2014-02-18 MED ORDER — SODIUM CHLORIDE 0.9 % IV SOLN
INTRAVENOUS | Status: AC
  Filled 2014-02-18: qty 50

## 2014-02-18 MED ORDER — FENTANYL CITRATE 0.05 MG/ML IJ SOLN
25.0000 ug | INTRAMUSCULAR | Status: AC
  Administered 2014-02-18 (×2): 25 ug via INTRAVENOUS

## 2014-02-18 MED ORDER — PROPOFOL 10 MG/ML IV BOLUS
INTRAVENOUS | Status: DC | PRN
  Administered 2014-02-18: 140 mg via INTRAVENOUS

## 2014-02-18 MED ORDER — SODIUM CHLORIDE 0.9 % IV SOLN
INTRAVENOUS | Status: DC | PRN
  Administered 2014-02-18: 18:00:00

## 2014-02-18 MED ORDER — DEXTROSE 5 % IV SOLN: 1.0000 g | Freq: Once | INTRAVENOUS | Status: DC

## 2014-02-18 MED ORDER — GLUCAGON HCL (RDNA) 1 MG IJ SOLR
INTRAMUSCULAR | Status: DC | PRN
  Administered 2014-02-18 (×5): 0.25 mg via INTRAVENOUS
  Administered 2014-02-18: .5 mg via INTRAVENOUS
  Administered 2014-02-18: 0.25 mg via INTRAVENOUS

## 2014-02-18 MED ORDER — ONDANSETRON HCL 4 MG/2ML IJ SOLN
4.0000 mg | Freq: Once | INTRAMUSCULAR | Status: AC
  Administered 2014-02-18: 4 mg via INTRAVENOUS

## 2014-02-18 MED ORDER — STERILE WATER FOR IRRIGATION IR SOLN
Status: DC | PRN
  Administered 2014-02-18: 18:00:00

## 2014-02-18 MED ORDER — MIDAZOLAM HCL 2 MG/2ML IJ SOLN
1.0000 mg | INTRAMUSCULAR | Status: DC | PRN
  Administered 2014-02-18 (×2): 2 mg via INTRAVENOUS
  Filled 2014-02-18: qty 2

## 2014-02-18 MED ORDER — FENTANYL CITRATE 0.05 MG/ML IJ SOLN
INTRAMUSCULAR | Status: DC | PRN
Start: 2014-02-18 — End: 2014-02-18
  Administered 2014-02-18 (×2): 50 ug via INTRAVENOUS

## 2014-02-18 MED ORDER — MIDAZOLAM HCL 2 MG/2ML IJ SOLN
INTRAMUSCULAR | Status: AC
  Filled 2014-02-18: qty 2

## 2014-02-18 MED ORDER — INDOMETHACIN 50 MG RE SUPP
RECTAL | Status: AC
  Filled 2014-02-18: qty 1

## 2014-02-18 MED ORDER — GLYCOPYRROLATE 0.2 MG/ML IJ SOLN
INTRAMUSCULAR | Status: AC
  Filled 2014-02-18: qty 1

## 2014-02-18 MED ORDER — DEXTROSE 5 % IV SOLN
INTRAVENOUS | Status: AC
  Filled 2014-02-18: qty 10

## 2014-02-18 MED ORDER — MIDAZOLAM HCL 5 MG/5ML IJ SOLN
INTRAMUSCULAR | Status: DC | PRN
  Administered 2014-02-18: 2 mg via INTRAVENOUS

## 2014-02-18 MED ORDER — LACTATED RINGERS IV SOLN
INTRAVENOUS | Status: DC
  Administered 2014-02-18 (×2): via INTRAVENOUS

## 2014-02-18 MED ORDER — DEXTROSE 5 % IV SOLN
1.0000 g | INTRAVENOUS | Status: DC | PRN
  Administered 2014-02-18: 1 g via INTRAVENOUS

## 2014-02-18 MED ORDER — GLUCAGON HCL (RDNA) 1 MG IJ SOLR
INTRAMUSCULAR | Status: AC
  Filled 2014-02-18: qty 2

## 2014-02-18 MED ORDER — FENTANYL CITRATE 0.05 MG/ML IJ SOLN
25.0000 ug | INTRAMUSCULAR | Status: DC | PRN
  Administered 2014-02-18 (×4): 50 ug via INTRAVENOUS
  Filled 2014-02-18 (×3): qty 2

## 2014-02-18 SURGICAL SUPPLY — 16 items
BAG HAMPER (MISCELLANEOUS) ×2 IMPLANT
DEVICE LOCKING W-BIOPSY CAP (MISCELLANEOUS) ×2 IMPLANT
GUIDEWIRE JAG HINI 025X260CM (WIRE) ×1 IMPLANT
KIT CLEAN ENDO COMPLIANCE (KITS) ×2 IMPLANT
KIT ROOM TURNOVER APOR (KITS) ×2 IMPLANT
LUBRICANT JELLY 4.5OZ STERILE (MISCELLANEOUS) ×1 IMPLANT
PAD ARMBOARD 7.5X6 YLW CONV (MISCELLANEOUS) ×2 IMPLANT
SPHINCTEROTOME AUTOTOME .25 (MISCELLANEOUS) ×3 IMPLANT
SPHINCTEROTOME HYDRATOME 44 (MISCELLANEOUS) ×3 IMPLANT
SPONGE GAUZE 4X4 12PLY (GAUZE/BANDAGES/DRESSINGS) ×2 IMPLANT
SYR 3ML LL SCALE MARK (SYRINGE) ×1 IMPLANT
SYSTEM CONTINUOUS INJECTION (MISCELLANEOUS) ×2 IMPLANT
TUBING ENDO SMARTCAP PENTAX (MISCELLANEOUS) ×2 IMPLANT
WALLSTENT METAL COVERED 10X60 (STENTS) IMPLANT
WALLSTENT METAL COVERED 10X80 (STENTS) IMPLANT
WATER STERILE IRR 1000ML POUR (IV SOLUTION) ×2 IMPLANT

## 2014-02-18 NOTE — Op Note (Addendum)
ERCP PROCEDURE REPORT  PATIENT:  Pamela Wall  MR#:  [id number] Birthdate:  [birth year], 33 y.o., female Endoscopist:  Dr. Rogene Houston, MD Referred By:  Dr. Sherrlyn Hock, MD Procedure Date: 02/18/2014  Procedure:   ERCP    Indications:  Patient is a 33 year old Caucasian female who was biliary leak following open cholecystectomy. Intraoperative cholangiogram revealed mildly dilated biliary system. She has minimally elevated transaminases and alkaline phosphatase. She is undergoing ERCP with biliary stent placement.            Informed Consent:  The risks, benefits, limitations, alternatives, and mponderable have been reviewed with the patient. I specifically discussed a 1 in 10 chance of pancreatitis, reaction to medications, bleeding, perforation and the possibility of a failed ERCP. Potential for sphincterotomy and stent placement also reviewed. Questions have been answered. All parties agreeable.  Please see history & physical in medical record for more information.  Medications:  Gen. endotracheal anesthesia. *Please see anesthesia record for complete details  Description of procedure:  Procedure performed in the OR. The patient was placed under anesthesia, intubated, and turned into semipermanent position. Therapeutic Pentax video duodenoscope passed through the oropharynx without any difficulty into the esophagus, stomach, and across the pylorus and pull, and descending duodenum.    Findings:  Normal appearing ampulla of Vater with small orifice. Cannulation attempted with Rx 44 autotome and 035 hydrajag but CBD could not be cannulated. Cannulation was also attempted with Rx 39 autotome and 026 hydrajag wire the bile duct could not be cannulated.  Therapeutic/Diagnostic Maneuvers Performed:  None  Complications:  None  Impression:  Normal ampulla of Vater with small orifice. Unsuccessful ERCP with inability to cannulate CBD.   Recommendations:  Indomethacin suppository  50 mg per rectum now. Patient will returning to [facility] to care of Dr. Truddie Hidden. Will confer with GI physicians at [facility] about transfer tomorrow. Would recommend waiting at least 48 hours before ERCP attempted again. Consider abdominal CT because of persistent pain.  Rogene Houston  02/18/2014  7:20 PM  CC: Dr. Jenna Luo TOM, MD & Dr. Rayne Du ref. provider found

## 2014-02-18 NOTE — Anesthesia Postprocedure Evaluation (Signed)
  Anesthesia Post-op Note  Patient: Pamela Wall  Procedure(s) Performed: Procedure(s): UNSUCCESSFUL ENDOSCOPIC RETROGRADE CHOLANGIOPANCREATOGRAPHY (ERCP) (N/A)  Patient Location: PACU  Anesthesia Type:General  Level of Consciousness: awake, alert , oriented and patient cooperative  Airway and Oxygen Therapy: Patient Spontanous Breathing  Post-op Pain: 5 /10, moderate  Post-op Assessment: Post-op Vital signs reviewed, Patient's Cardiovascular Status Stable, Respiratory Function Stable, Patent Airway and No signs of Nausea or vomiting  Post-op Vital Signs: Reviewed and stable  Last Vitals:  Filed Vitals:   02/18/14 1700  BP: 108/73  Pulse:   Temp:   Resp: 17    Complications: No apparent anesthesia complications

## 2014-02-18 NOTE — Transfer of Care (Signed)
Immediate Anesthesia Transfer of Care Note  Patient: Pamela Wall  Procedure(s) Performed: Procedure(s): UNSUCCESSFUL ENDOSCOPIC RETROGRADE CHOLANGIOPANCREATOGRAPHY (ERCP) (N/A)  Patient Location: PACU  Anesthesia Type:General  Level of Consciousness: awake and patient cooperative  Airway & Oxygen Therapy: Patient Spontanous Breathing and Patient connected to face mask oxygen  Post-op Assessment: Report given to PACU RN, Post -op Vital signs reviewed and stable and Patient moving all extremities  Post vital signs: Reviewed and stable  Complications: No apparent anesthesia complications

## 2014-02-18 NOTE — Discharge Instructions (Signed)
Patient to be transferred to [facility] by ambulance when she has recovered from anesthesia.

## 2014-02-18 NOTE — H&P (Signed)
Pamela Wall is an 33 y.o. female.   Chief Complaint: Patient is here for ERCP and stenting for biliary leak. HPI: Patient is 33 year old Caucasian female who underwent cholecystectomy of 02/12/2014 by Dr. Sherrlyn Hock at [facility]. She was noted to have embadded gallbladder which was partially removed according to Dr. Truddie Hidden. She had injury to left hepatic duct which was repaired. She had intraoperative cholangiogram which revealed dilated biliary system with narrowing in the common hepatic and leak from one of the right hepatic branches. Drain was left in place but she has continued to drain bile. Dr. Truddie Hidden therefore felt that she will need biliary stenting and arrange for ERCP. Patient continues to complain of constant pain in the right upper quadrant of her abdomen which she describes anywhere from 7-10. She has been requiring Dilaudid frequently. She denies nausea or vomiting. She also denies fever or chills. She has been on full liquids. He also complains of diarrhea since she has not been using Imodium. 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 I have reviewed intraoperative cholangiogram and Dr. Thornton Papas.  Past Medical History  Diagnosis Date  . Scoliosis   . Endodermal sinus tumor   . Osteopenia   . Enterocolitis   . 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 Depression     history - no meds  . Asthma     childhood - no prob as adult  . Cancer     endometrial  sinus tumor  . Back pain     Past Surgical History  Procedure Laterality Date  . Abdominal hysterectomy      TAH BSO-vaginectomy  . Pelvic laparoscopy  2009    Expl. Lap  . Diagnostic laparoscopy    . Wisdom tooth extraction      Family History  Problem Relation Age of Onset  . Diabetes Father   . Breast cancer Paternal Grandmother    . Diabetes Paternal Grandmother    Social History:  reports that she has been smoking Cigarettes.  She has a 2.5 pack-year smoking history. She has never used smokeless tobacco. She reports that she drinks alcohol. She reports that she does not use illicit drugs.  Allergies: No Known Allergies  Medications Prior to Admission  Medication Sig Dispense Refill  . Ciprofloxacin (CIPRO PO) Take by mouth. Bid x 7 days from ED ?? strength      . estrogen-methylTESTOSTERone (ESTRATEST) 1.25-2.5 MG per tablet Take 1 tablet by mouth daily.  30 tablet  3  . fluconazole (DIFLUCAN) 150 MG tablet Take 1 tablet (150 mg total) by mouth once.  1 tablet  0  . Loperamide HCl (IMODIUM PO) Take 2-4 mg by mouth 3 (three) times daily. 2 caps in am, 1 cap at lunch and at bedtime      . oxyCODONE (ROXICODONE) 15 MG immediate release tablet Take 15 mg by mouth every 4 (four) hours while awake. Pt takes 5 times a day      . varenicline (CHANTIX CONTINUING MONTH PAK) 1 MG tablet Take 1 tablet (1 mg total) by mouth 2 (two) times daily.  60 tablet  3  . varenicline (CHANTIX) 0.5 MG tablet Take 1 tablet (0.5 mg total) by mouth 2 (two) times daily.  60 tablet  0    No results found  for this or any previous visit (from the past 48 hour(s)). No results found.  ROS  Blood pressure 111/81, pulse 93, temperature 98.6 F (37 C), temperature source Oral, resp. rate 16, last menstrual period 08/15/1984, SpO2 97.00%. Physical Exam  Constitutional:  Well-developed thin Caucasian female who appears to be in pain.  HENT:  Mouth/Throat: Oropharynx is clear and moist.  Eyes: Conjunctivae are normal. No scleral icterus.  Neck: No thyromegaly present.  Cardiovascular: Normal rate, regular rhythm and normal heart sounds.   No murmur heard. Respiratory: Effort normal and breath sounds normal.  GI:  She has dressing covering half of her abdomen. J-P drain is in place with bile in the container. Abdomen is soft with mild  generalized tenderness across lower abdomen and moderate tenderness in the epigastrium and right upper quadrant.  Musculoskeletal: She exhibits no edema.  Lymphadenopathy:    She has no cervical adenopathy.  Neurological: She is alert.  Skin: Skin is warm and dry.    Lab data reviewed to include CBC comprehensive chemistry panel and INR from this morning done at [facility]. AP and transaminases are mildly elevated.  Assessment/Plan Patient is 33 year old Caucasian female who is status post open cholecystectomy 6 days ago who now has biliary leak which has not healed spontaneously. She also has narrowing to segment of common hepatic duct there does not appear to be critical. Procedure and risks reviewed with the patient and her parents and they are all in agreement. Will proceed with ERCP with biliary stenting. Pancreatic stenting to be considered for prophylaxis. Patient will be returning to [facility] after the procedure.  Rogene Houston 02/18/2014, 12:54 PM

## 2014-02-18 NOTE — Anesthesia Procedure Notes (Signed)
Procedure Name: Intubation Date/Time: 02/18/2014 5:21 PM Performed by: Charmaine Downs Pre-anesthesia Checklist: Patient being monitored, Suction available, Emergency Drugs available and Patient identified Patient Re-evaluated:Patient Re-evaluated prior to inductionOxygen Delivery Method: Circle system utilized Preoxygenation: Pre-oxygenation with 100% oxygen Intubation Type: IV induction, Rapid sequence and Cricoid Pressure applied Ventilation: Mask ventilation without difficulty Laryngoscope Size: Mac and 3 Grade View: Grade I Tube type: Oral Tube size: 7.0 mm Number of attempts: 1 Airway Equipment and Method: Stylet Placement Confirmation: ETT inserted through vocal cords under direct vision,  positive ETCO2 and breath sounds checked- equal and bilateral Secured at: 20 cm Tube secured with: Tape Dental Injury: Teeth and Oropharynx as per pre-operative assessment

## 2014-02-18 NOTE — Anesthesia Preprocedure Evaluation (Signed)
Anesthesia Evaluation  Patient identified by MRN, date of birth, ID band Patient awake    Reviewed: Allergy & Precautions, H&P , NPO status , Patient's Chart, lab work & pertinent test results  Airway Mallampati: I TM Distance: >3 FB Neck ROM: full    Dental no notable dental hx. (+) Teeth Intact   Pulmonary neg pulmonary ROS, asthma (childhood , not active now) , Current Smoker,  breath sounds clear to auscultation  Pulmonary exam normal       Cardiovascular negative cardio ROS  Rhythm:Regular Rate:Normal     Neuro/Psych PSYCHIATRIC DISORDERS Depression negative neurological ROS     GI/Hepatic negative GI ROS, Neg liver ROS,   Endo/Other  negative endocrine ROS  Renal/GU negative Renal ROS  negative genitourinary   Musculoskeletal negative musculoskeletal ROS (+)   Abdominal Normal abdominal exam  (+)   Peds negative pediatric ROS (+)  Hematology negative hematology ROS (+)   Anesthesia Other Findings   Reproductive/Obstetrics negative OB ROS                           Anesthesia Physical Anesthesia Plan  ASA: II  Anesthesia Plan: General   Post-op Pain Management:    Induction: Intravenous, Rapid sequence and Cricoid pressure planned  Airway Management Planned: Oral ETT  Additional Equipment:   Intra-op Plan:   Post-operative Plan: Extubation in OR  Informed Consent: I have reviewed the patients History and Physical, chart, labs and discussed the procedure including the risks, benefits and alternatives for the proposed anesthesia with the patient or authorized representative who has indicated his/her understanding and acceptance.     Plan Discussed with:   Anesthesia Plan Comments:         Anesthesia Quick Evaluation

## 2014-02-18 NOTE — Progress Notes (Signed)
Pt arrived to holding area pre op Room #5 via st by EMS.  Complaining of pain in abdomen S/P open cholecystectomy at [facility] 4 days ago.   c/o painful iv left lower forarm.  IV removed and restarted upper left forarm. Dr Laural Golden in to assess pt.  And explained procedure and risks to pt.  Consent obtained.

## 2014-02-19 DIAGNOSIS — K929 Disease of digestive system, unspecified: Secondary | ICD-10-CM

## 2014-02-22 ENCOUNTER — Encounter (HOSPITAL_COMMUNITY): Payer: Self-pay | Admitting: Internal Medicine

## 2014-04-12 ENCOUNTER — Ambulatory Visit (INDEPENDENT_AMBULATORY_CARE_PROVIDER_SITE_OTHER): Payer: Medicaid Other | Admitting: Physician Assistant

## 2014-04-12 ENCOUNTER — Encounter: Payer: Self-pay | Admitting: Physician Assistant

## 2014-04-12 VITALS — BP 128/88 | HR 100 | Temp 98.6°F | Resp 18 | Wt 99.0 lb

## 2014-04-12 DIAGNOSIS — G4700 Insomnia, unspecified: Secondary | ICD-10-CM

## 2014-04-12 DIAGNOSIS — F411 Generalized anxiety disorder: Secondary | ICD-10-CM

## 2014-04-12 MED ORDER — DIAZEPAM 5 MG PO TABS: 5.0000 mg | ORAL_TABLET | Freq: Four times a day (QID) | ORAL | Status: DC | PRN

## 2014-04-12 NOTE — Progress Notes (Signed)
Patient ID: Pamela Wall MRN: [id number], DOB: 06/18/1981, 33 y.o. Date of Encounter: @DATE @  Chief Complaint:  Chief Complaint  Patient presents with  . c/o incisional hernia (choley),    refill anti anxiety meds    HPI: 33 y.o. year old white female  presents with her grandmother for office visit today.  She says that she was went to [facility] for a laparoscopic cholecystectomy. She was to have the procedure and go home the next day. However apparently once they got in, they found that her gallbladder was behind the liver and was adhered to the liver. They ended up doing an open cholecystectomy and patient says that they punctured her liver. Says that she was in [place] from 02/12/2014 - 02/19/14 and was transferred to [facility] from there. Was at [facility] from 4/17 through 4/24. She is scheduled for her next procedure 04/26/14--this is to place a stent in the bile duct. She also knows that she will have to have at least one other procedure to remove the current stent that she already has placed. As well she thinks that she has an incisional hernia. She and her grandmother report that the incision site looked like it does now as soon as they removed the staples -- this was done at [place] that she is seeing a Dr. Carlis Abbott, Dr. Amalia Hailey, Dr. Penelope Galas at [facility]. Also seeing a doctor [place]--pain management. Says that she had an MRI last week and has an appointment to see Dr. Carlis Abbott 6/22 prior to the stent procedure.  Says that Dr. Greta Doom at the pain clinic is prescribing the cyclobenzaprine and oxycodone.  Says one of the doctors at [facility] has been prescribing the Valium. However she has been unable to get in touch with him to get refills. Says the 10 mg is too strong and " knocks her out". Says a half of a pill (5 mg) works perfectly. As the prior to all of this she never had any problems with anxiety. Also never had problems with insomnia. Currently he is prescribed  amitriptyline for the insomnia. This is being prescribed by Dr. Greta Doom the pain clinic. Says prior to the amitriptyline she wasn't getting any sleep. With the amitriptyline, she is able to get 4 hours of sleep but that is all.  She says that prior to this she was very active and functional.  She has 2 children ages 68 and age  72.  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 She and her kids are staying with her grandmother who is with her at the visit today.  Past Medical History  Diagnosis Date  . Scoliosis   . Endodermal sinus tumor   . Osteopenia   . Enterocolitis   . 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 Depression     history - no meds  . Asthma     childhood - no prob as adult  . Cancer     endometrial  sinus tumor  . Back pain      Home Meds: Outpatient Prescriptions Prior to Visit  Medication Sig Dispense Refill  . oxyCODONE (ROXICODONE) 15 MG immediate release tablet Take 15 mg by mouth every 4 (four) hours as needed. Pt takes 5 times a day      . Ciprofloxacin (CIPRO PO) Take by mouth. Bid x 7 days from ED ?? strength      .  estrogen-methylTESTOSTERone (ESTRATEST) 1.25-2.5 MG per tablet Take 1 tablet by mouth daily.  30 tablet  3  . fluconazole (DIFLUCAN) 150 MG tablet Take 1 tablet (150 mg total) by mouth once.  1 tablet  0  . Loperamide HCl (IMODIUM PO) Take 2-4 mg by mouth 3 (three) times daily. 2 caps in am, 1 cap at lunch and at bedtime      . varenicline (CHANTIX CONTINUING MONTH PAK) 1 MG tablet Take 1 tablet (1 mg total) by mouth 2 (two) times daily.  60 tablet  3   No facility-administered medications prior to visit.    Allergies: No Known Allergies  History   Social History  . Marital Status: Single    Spouse Name: N/A    Number of Children: N/A  . Years of Education: N/A   Occupational History  . Not on file.   Social History Main Topics  . Smoking  status: Current Every Day Smoker -- 0.25 packs/day for 10 years    Types: E-cigarettes  . Smokeless tobacco: Never Used  . Alcohol Use: Yes     Comment: socially  . Drug Use: No  . Sexual Activity: Yes    Birth Control/ Protection: Surgical   Other Topics Concern  . Not on file   Social History Narrative  . No narrative on file    Family History  Problem Relation Age of Onset  . Diabetes Father   . Breast cancer Paternal Grandmother   . Diabetes Paternal Grandmother      Review of Systems:  See HPI for pertinent ROS. All other ROS negative.    Physical Exam: Blood pressure 128/88, pulse 100, temperature 98.6 F (37 C), temperature source Oral, resp. rate 18, weight 99 lb (44.906 kg), last menstrual period 08/15/1984., Body mass index is 19.33 kg/(m^2). General: WNWD WF. Appears in no acute distress. Neck: Supple. No thyromegaly. No lymphadenopathy. Lungs: Clear bilaterally to auscultation without wheezes, rales, or rhonchi. Breathing is unlabored. Heart: RRR with S1 S2. No murmurs, rubs, or gallops. Abdomen:  There is a scar, running diagonally, covering entire right upper abdomen. Just above the scar, there is an area of protrusion. This area is firm with palpation and not soft.  Musculoskeletal:  Strength and tone normal for age. Extremities/Skin: Warm and dry.  Neuro: Alert and oriented X 3. Moves all extremities spontaneously. Gait is normal. CNII-XII grossly in tact. Psych:  Responds to questions appropriately with a normal affect.     ASSESSMENT AND PLAN:  33 y.o. year old female with  1. Anxiety state, unspecified Discussed SSRIs. However the patient states she had no problems with anxiety prior to the above event. Says that she does not feel that she's going to need to continue any medication once she gets through these upcoming procedures. Wants to continue medication that she can use as needed. - diazepam (VALIUM) 5 MG tablet; Take 1 tablet (5 mg total) by  mouth every 6 (six) hours as needed for anxiety.  Dispense: 90 tablet; Refill: 0  2. Insomnia She does not want to use Ambien--her grandmother has used this in the past and it did things in her sleep but she did not even remember doing. Patient would prefer to try using the Valium at nighttime. - diazepam (VALIUM) 5 MG tablet; Take 1 tablet (5 mg total) by mouth every 6 (six) hours as needed for anxiety.  Dispense: 90 tablet; Refill: 0  Told her to call me if we need to adjust medication  or when she needs a refill.   Marin [place], [place], [facility] 04/12/2014 6:38 PM

## 2014-05-03 ENCOUNTER — Telehealth: Payer: Self-pay | Admitting: Physician Assistant

## 2014-05-03 ENCOUNTER — Telehealth: Payer: Self-pay | Admitting: Family Medicine

## 2014-05-03 DIAGNOSIS — F411 Generalized anxiety disorder: Secondary | ICD-10-CM

## 2014-05-03 DIAGNOSIS — G4700 Insomnia, unspecified: Secondary | ICD-10-CM

## 2014-05-03 NOTE — Telephone Encounter (Signed)
[phone] Patient is calling to get refill on her diazepam

## 2014-05-03 NOTE — Telephone Encounter (Signed)
Approved for #90+0 additional refills

## 2014-05-03 NOTE — Telephone Encounter (Signed)
Requesting refill of Valium.  Last RX 04/12/14 #90.  States has been using more often.  Said she was to call you as needed to let you know how she was doing.

## 2014-05-03 NOTE — Telephone Encounter (Signed)
Wrong chart, pt has two charts.

## 2014-05-04 MED ORDER — DIAZEPAM 5 MG PO TABS: 5.0000 mg | ORAL_TABLET | Freq: Four times a day (QID) | ORAL | Status: DC | PRN

## 2014-05-04 NOTE — Telephone Encounter (Signed)
RX called in .

## 2014-05-20 ENCOUNTER — Encounter: Payer: Self-pay | Admitting: Physician Assistant

## 2014-05-20 ENCOUNTER — Ambulatory Visit: Payer: Self-pay | Admitting: Physician Assistant

## 2014-05-20 ENCOUNTER — Ambulatory Visit (INDEPENDENT_AMBULATORY_CARE_PROVIDER_SITE_OTHER): Payer: Medicaid Other | Admitting: Physician Assistant

## 2014-05-20 VITALS — BP 122/78 | HR 64 | Temp 98.0°F | Resp 12 | Ht 60.0 in | Wt 111.0 lb

## 2014-05-20 DIAGNOSIS — F4322 Adjustment disorder with anxiety: Secondary | ICD-10-CM

## 2014-05-20 DIAGNOSIS — G4700 Insomnia, unspecified: Secondary | ICD-10-CM

## 2014-05-20 DIAGNOSIS — M7989 Other specified soft tissue disorders: Secondary | ICD-10-CM

## 2014-05-20 DIAGNOSIS — F411 Generalized anxiety disorder: Secondary | ICD-10-CM

## 2014-05-20 LAB — COMPLETE METABOLIC PANEL WITH GFR
ALK PHOS: 88 U/L (ref 39–117)
ALT: 43 U/L — ABNORMAL HIGH (ref 0–35)
AST: 37 U/L (ref 0–37)
Albumin: 3.9 g/dL (ref 3.5–5.2)
BILIRUBIN TOTAL: 0.2 mg/dL (ref 0.2–1.2)
BUN: 8 mg/dL (ref 6–23)
CALCIUM: 9.2 mg/dL (ref 8.4–10.5)
CO2: 29 mEq/L (ref 19–32)
Chloride: 103 mEq/L (ref 96–112)
Creat: 0.69 mg/dL (ref 0.50–1.10)
GFR, Est African American: >89 mL/min
GFR, Est Non African American: >89 mL/min
Glucose, Bld: 83 mg/dL (ref 70–99)
Potassium: 4.4 mEq/L (ref 3.5–5.3)
Sodium: 140 mEq/L (ref 135–145)
Total Protein: 6.3 g/dL (ref 6.0–8.3)

## 2014-05-20 MED ORDER — DIAZEPAM 5 MG PO TABS
5.0000 mg | ORAL_TABLET | Freq: Four times a day (QID) | ORAL | Status: DC | PRN
Start: 2014-05-20 — End: 2014-07-02

## 2014-05-20 NOTE — Progress Notes (Signed)
Patient ID: Pamela Wall MRN: [id number], DOB: 08-Jul-1981, 33 y.o. Date of Encounter: @DATE @  Chief Complaint:  Chief Complaint  Patient presents with  . BLE swelling    x3 days- ankles and knees sweeling- has gone down since yesterday    HPI: 33 y.o. year old white female  Presents with above.     Her LOV with me was on 04/12/2014. The following was documented at that OV:   She says that she was went to [facility] for a laparoscopic cholecystectomy. She was to have the procedure and go home the next day. However apparently once they got in, they found that her gallbladder was behind the liver and was adhered to the liver. They ended up doing an open cholecystectomy and patient says that they punctured her liver. Says that she was in [place] from 02/12/2014 - 02/19/14 and was transferred to [facility] from there. Was at [facility] from 4/17 through 4/24. She is scheduled for her next procedure 04/26/14--this is to place a stent in the bile duct. She also knows that she will have to have at least one other procedure to remove the current stent that she already has placed. As well she thinks that she has an incisional hernia. She and her grandmother report that the incision site looked like it does now as soon as they removed the staples -- this was done at [place] that she is seeing a Dr. Carlis Abbott, Dr. Amalia Hailey, Dr. Penelope Galas at [facility]. Also seeing a doctor [place]--pain management. Says that she had an MRI last week and has an appointment to see Dr. Carlis Abbott 6/22 prior to the stent procedure.  Says that Dr. Greta Doom at the pain clinic is prescribing the cyclobenzaprine and oxycodone.  Says one of the doctors at [facility] has been prescribing the Valium. However she has been unable to get in touch with him to get refills. Says the 10 mg is too strong and " knocks her out". Says a half of a pill (5 mg) works perfectly. As the prior to all of this she never had any problems with anxiety.  Also never had problems with insomnia. Currently he is prescribed amitriptyline for the insomnia. This is being prescribed by Dr. Greta Doom the pain clinic. Says prior to the amitriptyline she wasn't getting any sleep. With the amitriptyline, she is able to get 4 hours of sleep but that is all.  She says that prior to this she was very active and functional.  She has 2 children ages 23 and age  31.  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 She and her kids are staying with her grandmother who is with her at the visit today.   TODAY:   Today she she reports that when she went for followup at [facility] that they were able to remove the stent and did not require any further surgeries. Also says that they felt the protrusion at the right upper quadrant may be secondary to layer of adipose tissue which was not pressed down firmly prior to the sutures being placed-- or  felt there could possibly be a small area of hernia -- plan to just monitor this for now. Patient states that they went down her throat to remove the stent. Says that she has had no further abdominal surgeries since her last visit with me.  However, for some reason, she developed swelling of both of her legs from  July 13 -  July 15. She says that both her ankles and knees appeared swollen as big as her calves. However last night she elevated her legs and applied ice and this morning the swelling has resolved.  Also, she says that she is trying to wean down the diazepam but is wondering is she can get a refill. Says that she does still have times of feeling as if she is "shaking on the inside "and also having difficulty sleeping.   Past Medical History  Diagnosis Date  . Scoliosis   . Endodermal sinus tumor   . Osteopenia   . Enterocolitis   . 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 Depression     history - no meds  . Asthma      childhood - no prob as adult  . Cancer     endometrial  sinus tumor  . Back pain      Home Meds: Outpatient Prescriptions Prior to Visit  Medication Sig Dispense Refill  . amitriptyline (ELAVIL) 50 MG tablet Take 50 mg by mouth at bedtime.      . cyclobenzaprine (FLEXERIL) 10 MG tablet Take 10 mg by mouth 3 (three) times daily as needed.      Marland Kitchen oxyCODONE (ROXICODONE) 15 MG immediate release tablet Take 15 mg by mouth every 4 (four) hours as needed. Pt takes 5 times a day      . diazepam (VALIUM) 5 MG tablet Take 1 tablet (5 mg total) by mouth every 6 (six) hours as needed for anxiety.  90 tablet  0   No facility-administered medications prior to visit.    Allergies: No Known Allergies  History   Social History  . Marital Status: Single    Spouse Name: N/A    Number of Children: N/A  . Years of Education: N/A   Occupational History  . Not on file.   Social History Main Topics  . Smoking status: Current Every Day Smoker -- 0.25 packs/day for 10 years    Types: E-cigarettes  . Smokeless tobacco: Never Used     Comment: vapor cigs  . Alcohol Use: Yes     Comment: socially  . Drug Use: No  . Sexual Activity: Yes    Birth Control/ Protection: Surgical   Other Topics Concern  . Not on file   Social History Narrative  . No narrative on file    Family History  Problem Relation Age of Onset  . Diabetes Father   . Breast cancer Paternal Grandmother   . Diabetes Paternal Grandmother      Review of Systems:  See HPI for pertinent ROS. All other ROS negative.    Physical Exam: Blood pressure 122/78, pulse 64, temperature 98 F (36.7 C), temperature source Oral, resp. rate 12, height 5' (1.524 m), weight 111 lb (50.349 kg), last menstrual period 08/15/1984., Body mass index is 21.68 kg/(m^2). General: WNWD WF. Appears in no acute distress. Neck: Supple. No thyromegaly. No lymphadenopathy. Lungs: Clear bilaterally to auscultation without wheezes, rales, or rhonchi.  Breathing is unlabored. Heart: RRR with S1 S2. No murmurs, rubs, or gallops. Musculoskeletal:  Strength and tone normal for age. Extremities/Skin: Warm and dry. NO Edema present at all today. Can see medial and lateral malleolus very clearly and even the veins on the tops of her feet are easily visualized.   Neuro: Alert and oriented X 3. Moves all extremities spontaneously. Gait is normal. CNII-XII grossly in  tact. Psych:  Responds to questions appropriately with a normal affect.     ASSESSMENT AND PLAN:  33 y.o. year old female with   1. Swelling of both lower extremities Will check labs. Otherwise, I think her swelling was probably secondary to increased sodium intake for several days. Told her to make sure to eat low-sodium diet. Followup with me if the swelling recurs. - COMPLETE METABOLIC PANEL WITH GFR  2. Adjustment disorder with anxious mood  3. Anxiety state, unspecified - diazepam (VALIUM) 5 MG tablet; Take 1 tablet (5 mg total) by mouth every 6 (six) hours as needed for anxiety.  Dispense: 90 tablet; Refill: 0  4. Insomnia - diazepam (VALIUM) 5 MG tablet; Take 1 tablet (5 mg total) by mouth every 6 (six) hours as needed for anxiety.  Dispense: 90 tablet; Refill: 0   Signed, [address] [place], [place], [facility] 05/20/2014 12:48 PM

## 2014-07-01 ENCOUNTER — Other Ambulatory Visit: Payer: Self-pay | Admitting: *Deleted

## 2014-07-01 ENCOUNTER — Telehealth: Payer: Self-pay | Admitting: *Deleted

## 2014-07-01 DIAGNOSIS — G4700 Insomnia, unspecified: Secondary | ICD-10-CM

## 2014-07-01 DIAGNOSIS — F411 Generalized anxiety disorder: Secondary | ICD-10-CM

## 2014-07-01 NOTE — Telephone Encounter (Signed)
ok 

## 2014-07-01 NOTE — Telephone Encounter (Signed)
Received fax requesting refill on Diazepam.   Ok to refill??  Last office visit/ refill 05/20/2014.

## 2014-07-02 MED ORDER — DIAZEPAM 5 MG PO TABS: 5.0000 mg | ORAL_TABLET | Freq: Four times a day (QID) | ORAL | Status: DC | PRN

## 2014-07-02 NOTE — Telephone Encounter (Signed)
Script called out to pharmacy

## 2014-10-27 ENCOUNTER — Encounter (INDEPENDENT_AMBULATORY_CARE_PROVIDER_SITE_OTHER): Payer: Self-pay

## 2014-11-08 ENCOUNTER — Telehealth: Payer: Self-pay | Admitting: Family Medicine

## 2014-11-08 ENCOUNTER — Telehealth: Payer: Self-pay | Admitting: *Deleted

## 2014-11-08 DIAGNOSIS — Z79890 Hormone replacement therapy (postmenopausal): Secondary | ICD-10-CM

## 2014-11-08 DIAGNOSIS — Z780 Asymptomatic menopausal state: Secondary | ICD-10-CM

## 2014-11-08 NOTE — Telephone Encounter (Signed)
[phone] Pt is needing a  Referral to see Elon Alas PA at [facility]

## 2014-11-08 NOTE — Telephone Encounter (Signed)
Pt called requesting refill on estratest 1.25-2.5 mg tablet, pt last seen in 2013, was taking bioidentical HRT unable to afford now. I did explain to patient that overdue for annual, she starts new job on Jan 15 and unable to get off work for 3 month for new job. Please advise if okay for pt to be worked in for annual.

## 2014-11-08 NOTE — Telephone Encounter (Signed)
No refill/medication prescription until seen.

## 2014-11-08 NOTE — Telephone Encounter (Signed)
Left message on pt voicemail with the below.

## 2014-11-09 NOTE — Telephone Encounter (Signed)
Pt states she is needng the referral d/t needs a pap smear usually has done every 6 mos but has been 2 years and needs a hormonal placement. ?ok to do referral?

## 2014-11-10 ENCOUNTER — Encounter: Payer: Self-pay | Admitting: Physician Assistant

## 2014-11-10 ENCOUNTER — Ambulatory Visit (INDEPENDENT_AMBULATORY_CARE_PROVIDER_SITE_OTHER): Payer: Medicaid Other | Admitting: Physician Assistant

## 2014-11-10 VITALS — BP 132/98 | HR 96 | Temp 98.5°F | Resp 20 | Wt 118.0 lb

## 2014-11-10 DIAGNOSIS — F4322 Adjustment disorder with anxiety: Secondary | ICD-10-CM

## 2014-11-10 DIAGNOSIS — I10 Essential (primary) hypertension: Secondary | ICD-10-CM | POA: Insufficient documentation

## 2014-11-10 DIAGNOSIS — R232 Flushing: Secondary | ICD-10-CM | POA: Insufficient documentation

## 2014-11-10 DIAGNOSIS — G4700 Insomnia, unspecified: Secondary | ICD-10-CM

## 2014-11-10 DIAGNOSIS — N951 Menopausal and female climacteric states: Secondary | ICD-10-CM

## 2014-11-10 MED ORDER — ESTROGENS CONJUGATED 0.3 MG PO TABS: 0.3000 mg | ORAL_TABLET | Freq: Every day | ORAL | Status: DC

## 2014-11-10 MED ORDER — AMLODIPINE BESYLATE 5 MG PO TABS: 5.0000 mg | ORAL_TABLET | Freq: Every day | ORAL | Status: DC

## 2014-11-10 MED ORDER — DIAZEPAM 2 MG PO TABS: 2.0000 mg | ORAL_TABLET | Freq: Four times a day (QID) | ORAL | Status: DC | PRN

## 2014-11-10 NOTE — Telephone Encounter (Signed)
Referral placed to [facility]

## 2014-11-10 NOTE — Progress Notes (Signed)
Patient ID: Pamela Wall MRN: [id number], DOB: June 05, 1981, 34 y.o. Date of Encounter: @DATE @  Chief Complaint:  Chief Complaint  Patient presents with  . c/o hot flashes and BP issues    HPI: 34 y.o. year old white female  Presents with above.     THE FOLLOWING WAS DOCUMENTED AT HER OV NOTE WITH ME ON---- 04/12/2014:   She says that she was went to [facility] for a laparoscopic cholecystectomy. She was to have the procedure and go home the next day. However apparently once they got in, they found that her gallbladder was behind the liver and was adhered to the liver. They ended up doing an open cholecystectomy and patient says that they punctured her liver. Says that she was in [place] from 02/12/2014 - 02/19/14 and was transferred to [facility] from there. Was at [facility] from 4/17 through 4/24. She is scheduled for her next procedure 04/26/14--this is to place a stent in the bile duct. She also knows that she will have to have at least one other procedure to remove the current stent that she already has placed. As well she thinks that she has an incisional hernia. She and her grandmother report that the incision site looked like it does now as soon as they removed the staples -- this was done at [facility] that she is seeing a Dr. Carlis Abbott, Dr. Amalia Hailey, Dr. Penelope Galas at [facility]. Also seeing a doctor [place]--pain management. Says that she had an MRI last week and has an appointment to see Dr. Carlis Abbott 6/22 prior to the stent procedure.  Says that Dr. Greta Doom at the pain clinic is prescribing the cyclobenzaprine and oxycodone.  Says one of the doctors at [facility] has been prescribing the Valium. However she has been unable to get in touch with him to get refills. Says the 10 mg is too strong and " knocks her out". Says a half of a pill (5 mg) works perfectly. As the prior to all of this she never had any problems with anxiety. Also never had problems with insomnia. Currently he is  prescribed amitriptyline for the insomnia. This is being prescribed by Dr. Greta Doom the pain clinic. Says prior to the amitriptyline she wasn't getting any sleep. With the amitriptyline, she is able to get 4 hours of sleep but that is all.  She says that prior to this she was very active and functional.  She has 2 children ages 7 and age  59.  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 She and her kids are staying with her grandmother who is with her at the visit today.   THE FOLLOWING IS FROM HER OV NOTE WITH ME ----05/20/2014:   Today she she reports that when she went for followup at [facility] that they were able to remove the stent and did not require any further surgeries. Also says that they felt the protrusion at the right upper quadrant may be secondary to layer of adipose tissue which was not pressed down firmly prior to the sutures being placed-- or  felt there could possibly be a small area of hernia -- plan to just monitor this for now. Patient states that they went down her throat to remove the stent. Says that she has had no further abdominal surgeries since her last visit with me.  However, for some reason, she developed swelling of both of her legs from July 13 -  July 15. She says that both her ankles and knees appeared swollen as big as her calves. However last night she elevated her legs and applied ice and this morning the swelling has resolved.  Also, she says that she is trying to wean down the diazepam but is wondering is she can get a refill. Says that she does still have times of feeling as if she is "shaking on the inside "and also having difficulty sleeping.   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  She had a hysterectomy secondary to cancer at age 32 months old. She says that she also had radiation therapy. It was later documented that the radiation  had "killed her ovaries". Says that she was on hormone replacement therapy to go through puberty. Later after that the hormone replacement therapy was used off and on. Says that when she was in her 32s she experienced hot flashes so they treated this with Premarin and Estratest. Says that they wanted to limit the amount of hormones that she was on so was eventually discontinued. Says that she went for years without any menopausal symptoms. However she says that she is now having significant sweats at night--even soaking her bed sheets--says that these are the exact same symptoms that she had had in the past. She recently called here about referral to GYN which I had approved. She is wanting to go ahead and get on some hormones to use while she waits for that appointment.  She also reports that her blood pressure has been a little bit high when she has gone to pain management recently. He says that back before this recent surgery her blood pressure used to be low in the 100s over 70s. Says that now it is about 145/100 every time she goes to pain management.  She is also requesting refill on Valium but wants to decrease the dose and just wants to have some available if needed.     Past Medical History  Diagnosis Date  . Scoliosis   . Endodermal sinus tumor   . Osteopenia   . Enterocolitis   . 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 Depression     history - no meds  . Asthma     childhood - no prob as adult  . Cancer     endometrial  sinus tumor  . Back pain      Home Meds: Outpatient Prescriptions Prior to Visit  Medication Sig Dispense Refill  . amitriptyline (ELAVIL) 50 MG tablet Take 50 mg by mouth at bedtime.    . cyclobenzaprine (FLEXERIL) 10 MG tablet Take 10 mg by mouth 3 (three) times daily as needed.    Marland Kitchen oxyCODONE (ROXICODONE) 15 MG immediate release tablet Take 15 mg by mouth every 4 (four) hours as needed. Pt takes 5 times a  day    . diazepam (VALIUM) 5 MG tablet Take 1 tablet (5 mg total) by mouth every 6 (six) hours as needed for anxiety. (Patient not taking: Reported on 11/10/2014) 90 tablet 0   No facility-administered medications prior to visit.    Allergies: No Known Allergies  History   Social History  . Marital Status: Single    Spouse Name: N/A    Number of Children: N/A  . Years of Education: N/A   Occupational History  . Not on file.   Social History Main Topics  .  Smoking status: Current Every Day Smoker -- 0.25 packs/day for 10 years    Types: E-cigarettes  . Smokeless tobacco: Never Used     Comment: vapor cigs  . Alcohol Use: Yes     Comment: socially  . Drug Use: No  . Sexual Activity: Yes    Birth Control/ Protection: Surgical   Other Topics Concern  . Not on file   Social History Narrative    Family History  Problem Relation Age of Onset  . Diabetes Father   . Breast cancer Paternal Grandmother   . Diabetes Paternal Grandmother      Review of Systems:  See HPI for pertinent ROS. All other ROS negative.    Physical Exam: Blood pressure 132/98, pulse 96, temperature 98.5 F (36.9 C), temperature source Oral, resp. rate 20, weight 118 lb (53.524 kg), last menstrual period 08/15/1984., Body mass index is 23.05 kg/(m^2). General: WNWD WF. Appears in no acute distress. Neck: Supple. No thyromegaly. No lymphadenopathy. Lungs: Clear bilaterally to auscultation without wheezes, rales, or rhonchi. Breathing is unlabored. Heart: RRR with S1 S2. No murmurs, rubs, or gallops. Musculoskeletal:  Strength and tone normal for age. Extremities/Skin: Warm and dry. NO Edema present at all today. Can see medial and lateral malleolus very clearly and even the veins on the tops of her feet are easily visualized.   Neuro: Alert and oriented X 3. Moves all extremities spontaneously. Gait is normal. CNII-XII grossly in tact. Psych:  Responds to questions appropriately with a normal affect.       ASSESSMENT AND PLAN:  34 y.o. year old female with    1. Adjustment disorder with anxiety - diazepam (VALIUM) 2 MG tablet; Take 1 tablet (2 mg total) by mouth every 6 (six) hours as needed for anxiety.  Dispense: 30 tablet; Refill: 0  2. Insomnia - diazepam (VALIUM) 2 MG tablet; Take 1 tablet (2 mg total) by mouth every 6 (six) hours as needed for anxiety.  Dispense: 30 tablet; Refill: 0  3. Hot flashes - estrogens, conjugated, (PREMARIN) 0.3 MG tablet; Take 1 tablet (0.3 mg total) by mouth daily.  Dispense: 30 tablet; Refill: 0  4. Essential hypertension - amLODipine (NORVASC) 5 MG tablet; Take 1 tablet (5 mg total) by mouth daily.  Dispense: 30 tablet; Refill: 0  I Have approved referral to gynecology so this is being worked on. She definitely needs to follow-up with gynecology. I will only fill this one supply of hormones. Will start amlodipine for blood pressure control. She will schedule follow-up office visit here in 2 weeks to recheck her blood pressure. She is to also be checking her blood pressure in the interim and keep a log of this to bring to her next appointment. ALSO, WILL CHECK   TSH    AT NEXT OV   Signed, [address] [place], [place], [facility] 11/10/2014 11:03 AM

## 2014-11-10 NOTE — Telephone Encounter (Signed)
Referral approved

## 2014-11-12 ENCOUNTER — Other Ambulatory Visit (HOSPITAL_COMMUNITY)
Admission: RE | Admit: 2014-11-12 | Discharge: 2014-11-12 | Disposition: A | Discharge status: Home / Self Care | Payer: Medicaid Other | Source: Ambulatory Visit | Attending: Women's Health | Admitting: Women's Health

## 2014-11-12 ENCOUNTER — Ambulatory Visit (INDEPENDENT_AMBULATORY_CARE_PROVIDER_SITE_OTHER): Payer: Medicaid Other | Admitting: Women's Health

## 2014-11-12 ENCOUNTER — Encounter: Payer: Self-pay | Admitting: Women's Health

## 2014-11-12 VITALS — BP 134/84 | Ht 61.0 in | Wt 116.0 lb

## 2014-11-12 DIAGNOSIS — Z113 Encounter for screening for infections with a predominantly sexual mode of transmission: Secondary | ICD-10-CM

## 2014-11-12 DIAGNOSIS — Z01419 Encounter for gynecological examination (general) (routine) without abnormal findings: Secondary | ICD-10-CM | POA: Diagnosis not present

## 2014-11-12 DIAGNOSIS — N951 Menopausal and female climacteric states: Secondary | ICD-10-CM | POA: Diagnosis not present

## 2014-11-12 DIAGNOSIS — Z780 Asymptomatic menopausal state: Secondary | ICD-10-CM | POA: Diagnosis not present

## 2014-11-12 DIAGNOSIS — Z01411 Encounter for gynecological examination (general) (routine) with abnormal findings: Secondary | ICD-10-CM | POA: Diagnosis not present

## 2014-11-12 DIAGNOSIS — Z13220 Encounter for screening for lipoid disorders: Secondary | ICD-10-CM | POA: Diagnosis not present

## 2014-11-12 DIAGNOSIS — R232 Flushing: Secondary | ICD-10-CM

## 2014-11-12 DIAGNOSIS — Z1151 Encounter for screening for human papillomavirus (HPV): Secondary | ICD-10-CM | POA: Insufficient documentation

## 2014-11-12 DIAGNOSIS — Z0000 Encounter for general adult medical examination without abnormal findings: Secondary | ICD-10-CM | POA: Diagnosis not present

## 2014-11-12 LAB — CBC WITH DIFFERENTIAL/PLATELET
Basophils Absolute: 0 10*3/uL (ref 0.0–0.1)
Basophils Relative: 0 % (ref 0–1)
Eosinophils Absolute: 0.2 10*3/uL (ref 0.0–0.7)
Eosinophils Relative: 3 % (ref 0–5)
HEMATOCRIT: 36.8 % (ref 36.0–46.0)
Hemoglobin: 12.9 g/dL (ref 12.0–15.0)
LYMPHS ABS: 3.2 10*3/uL (ref 0.7–4.0)
Lymphocytes Relative: 48 % — ABNORMAL HIGH (ref 12–46)
MCH: 30.4 pg (ref 26.0–34.0)
MCHC: 35.1 g/dL (ref 30.0–36.0)
MCV: 86.8 fL (ref 78.0–100.0)
MONO ABS: 0.3 10*3/uL (ref 0.1–1.0)
MPV: 8.8 fL (ref 8.6–12.4)
Monocytes Relative: 5 % (ref 3–12)
NEUTROS PCT: 44 % (ref 43–77)
Neutro Abs: 2.9 10*3/uL (ref 1.7–7.7)
PLATELETS: 299 10*3/uL (ref 150–400)
RBC: 4.24 MIL/uL (ref 3.87–5.11)
RDW: 13.8 % (ref 11.5–15.5)
WBC: 6.7 10*3/uL (ref 4.0–10.5)

## 2014-11-12 LAB — URINALYSIS W MICROSCOPIC + REFLEX CULTURE
BACTERIA UA: NONE SEEN
BILIRUBIN URINE: NEGATIVE
Casts: NONE SEEN
Crystals: NONE SEEN
Glucose, UA: NEGATIVE mg/dL
HGB URINE DIPSTICK: NEGATIVE
KETONES UR: NEGATIVE mg/dL
Leukocytes, UA: NEGATIVE
NITRITE: NEGATIVE
PROTEIN: NEGATIVE mg/dL
SQUAMOUS EPITHELIAL / LPF: NONE SEEN
Specific Gravity, Urine: <1.005 — ABNORMAL LOW (ref 1.005–1.030)
Urobilinogen, UA: 0.2 mg/dL (ref 0.0–1.0)
pH: 6 (ref 5.0–8.0)

## 2014-11-12 LAB — LIPID PANEL
Cholesterol: 188 mg/dL (ref 0–200)
HDL: 38 mg/dL — ABNORMAL LOW (ref >39)
LDL Cholesterol: 88 mg/dL (ref 0–99)
TRIGLYCERIDES: 311 mg/dL — AB (ref <150)
Total CHOL/HDL Ratio: 4.9 Ratio
VLDL: 62 mg/dL — AB (ref 0–40)

## 2014-11-12 LAB — COMPREHENSIVE METABOLIC PANEL
ALK PHOS: 122 U/L — AB (ref 39–117)
ALT: 55 U/L — AB (ref 0–35)
AST: 51 U/L — AB (ref 0–37)
Albumin: 4.4 g/dL (ref 3.5–5.2)
BUN: 4 mg/dL — ABNORMAL LOW (ref 6–23)
CALCIUM: 9.5 mg/dL (ref 8.4–10.5)
CO2: 26 mEq/L (ref 19–32)
Chloride: 101 mEq/L (ref 96–112)
Creat: 0.77 mg/dL (ref 0.50–1.10)
Glucose, Bld: 84 mg/dL (ref 70–99)
POTASSIUM: 3.8 meq/L (ref 3.5–5.3)
Sodium: 138 mEq/L (ref 135–145)
TOTAL PROTEIN: 7.2 g/dL (ref 6.0–8.3)
Total Bilirubin: 0.4 mg/dL (ref 0.2–1.2)

## 2014-11-12 MED ORDER — ESTROGENS CONJUGATED 0.3 MG PO TABS: 0.3000 mg | ORAL_TABLET | Freq: Every day | ORAL | Status: DC

## 2014-11-12 NOTE — Progress Notes (Signed)
Pamela Wall [birth year] [id number]    History:    Presents for annual exam.  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 Cholecystectomy 03/2014 had to have revision surgery due to complications of adhesions. Hot flashes increased after cholecystectomy primary care started her on premarin 0.3 daily. History of osteoporosis had been on Fosamax. Hypertension primary care manages.  Past medical history, past surgical history, family history and social history were all reviewed and documented in the EPIC chart. 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 Has full custody of her sister's children  32,  8,  6 months. Sister drug abuse.   ROS:  A ROS was performed and pertinent positives and negatives are included.  Exam:  Filed Vitals:   11/12/14 0921  BP: 134/84    General appearance:  Normal Thyroid:  Symmetrical, normal in size, without palpable masses or nodularity. Respiratory  Auscultation:  Clear without wheezing or rhonchi Cardiovascular  Auscultation:  Regular rate, without rubs, murmurs or gallops  Edema/varicosities:  Not grossly evident Abdominal  Soft,nontender, without masses, guarding or rebound.  Liver/spleen:  No organomegaly noted  Hernia:  None appreciated  Skin  Inspection:  Grossly normal   Breasts: Examined lying and sitting.     Right: Without masses, retractions, discharge or axillary adenopathy.     Left: Without masses, retractions, discharge or axillary adenopathy. Gentitourinary   Inguinal/mons:  Normal without inguinal adenopathy  External genitalia:  Normal  BUS/Urethra/Skene's glands:  Normal  Vagina: Atrophic  Cervix: Absent  Adnexa/parametria:     Rt: Without masses or tenderness.   Lt: Without masses or tenderness.  Anus and perineum: Normal  Digital rectal exam: Normal sphincter tone without palpated masses or tenderness  Assessment/Plan:  34 y.o. S WF G0 for annual exam.    STD screen HRT for menopausal hot  flushes Osteoporosis Hypertension-primary care manages  Plan: SBE's, exercise, calcium rich diet, vitamin D 1000 daily. Options reviewed will continue Premarin 0.3. Daily prescription, proper use given and reviewed. Instructed to call if no relief of hot flushes. Risk for blood clots, strokes, breast cancer reviewed. Condoms encouraged if sexually active. CBC, glucose, lipid panel, vitamin D, UA, Pap, GC chlamydia, HIV, hep B, C, RPR. Schedule DEXA, reviewed importance of regular exercise, home safety and fall prevention reviewed.   Pamela Wall [facility], 10:25 AM 11/12/2014

## 2014-11-12 NOTE — Patient Instructions (Signed)

## 2014-11-13 LAB — VITAMIN D 25 HYDROXY (VIT D DEFICIENCY, FRACTURES): VIT D 25 HYDROXY: 20 ng/mL — AB (ref 30–100)

## 2014-11-13 LAB — RPR

## 2014-11-13 LAB — HEPATITIS B SURFACE ANTIGEN: Hepatitis B Surface Ag: NEGATIVE

## 2014-11-13 LAB — HIV ANTIBODY (ROUTINE TESTING W REFLEX): HIV 1&2 Ab, 4th Generation: NONREACTIVE

## 2014-11-16 ENCOUNTER — Other Ambulatory Visit: Payer: Self-pay | Admitting: Women's Health

## 2014-11-16 DIAGNOSIS — R945 Abnormal results of liver function studies: Principal | ICD-10-CM

## 2014-11-16 DIAGNOSIS — E559 Vitamin D deficiency, unspecified: Secondary | ICD-10-CM

## 2014-11-16 DIAGNOSIS — R7989 Other specified abnormal findings of blood chemistry: Secondary | ICD-10-CM

## 2014-11-16 LAB — CYTOLOGY - PAP

## 2014-11-16 MED ORDER — VITAMIN D (ERGOCALCIFEROL) 1.25 MG (50000 UNIT) PO CAPS: 50000.0000 [IU] | ORAL_CAPSULE | ORAL | Status: DC

## 2014-12-09 ENCOUNTER — Other Ambulatory Visit: Payer: Self-pay | Admitting: Physician Assistant

## 2014-12-09 ENCOUNTER — Encounter: Payer: Self-pay | Admitting: Family Medicine

## 2014-12-09 NOTE — Telephone Encounter (Signed)
Pt was to return for follow up in 2 weeks after 11/10/14 visit.  Medication refill for one time only.  Patient needs to be seen.  Letter sent for patient to call and schedule

## 2014-12-14 ENCOUNTER — Telehealth: Payer: Self-pay | Admitting: *Deleted

## 2014-12-14 NOTE — Telephone Encounter (Signed)
Pt called asking if a letter could be sent to her job stating due to her medical condition allowing bathroom breaks. If you are okay with this I will send letter. Please advise

## 2014-12-14 NOTE — Telephone Encounter (Signed)
Yes, please write letter for her.  thanks

## 2014-12-15 NOTE — Telephone Encounter (Signed)
Left message for pt to call, pt asked me to e-mail letter.

## 2014-12-24 ENCOUNTER — Encounter: Payer: Self-pay | Admitting: *Deleted

## 2014-12-24 NOTE — Telephone Encounter (Signed)
Pt asked me to mail letter to her home address, this was done.

## 2015-01-31 ENCOUNTER — Ambulatory Visit (INDEPENDENT_AMBULATORY_CARE_PROVIDER_SITE_OTHER): Payer: Medicaid Other | Admitting: Family Medicine

## 2015-01-31 ENCOUNTER — Encounter: Payer: Self-pay | Admitting: Family Medicine

## 2015-01-31 VITALS — BP 136/84 | HR 78 | Temp 98.9°F | Resp 16 | Ht 61.0 in | Wt 114.0 lb

## 2015-01-31 DIAGNOSIS — G4700 Insomnia, unspecified: Secondary | ICD-10-CM | POA: Diagnosis not present

## 2015-01-31 DIAGNOSIS — J020 Streptococcal pharyngitis: Secondary | ICD-10-CM

## 2015-01-31 DIAGNOSIS — I10 Essential (primary) hypertension: Secondary | ICD-10-CM

## 2015-01-31 DIAGNOSIS — F4322 Adjustment disorder with anxiety: Secondary | ICD-10-CM

## 2015-01-31 LAB — RAPID STREP SCREEN (MED CTR MEBANE ONLY): Streptococcus, Group A Screen (Direct): POSITIVE — AB

## 2015-01-31 MED ORDER — AMLODIPINE BESYLATE 5 MG PO TABS: ORAL_TABLET | ORAL | Status: DC

## 2015-01-31 MED ORDER — DIAZEPAM 2 MG PO TABS: 2.0000 mg | ORAL_TABLET | Freq: Four times a day (QID) | ORAL | Status: DC | PRN

## 2015-01-31 MED ORDER — AMOXICILLIN 500 MG PO CAPS: 500.0000 mg | ORAL_CAPSULE | Freq: Two times a day (BID) | ORAL | Status: DC

## 2015-01-31 MED ORDER — VITAMIN D (ERGOCALCIFEROL) 1.25 MG (50000 UNIT) PO CAPS: 50000.0000 [IU] | ORAL_CAPSULE | ORAL | Status: DC

## 2015-01-31 MED ORDER — FLUCONAZOLE 150 MG PO TABS: 150.0000 mg | ORAL_TABLET | Freq: Once | ORAL | Status: DC

## 2015-01-31 NOTE — Patient Instructions (Signed)
Take antibiotics Diflucan Blood pressure medication refilled Valium was refilled F/U as needed or in 6 months

## 2015-01-31 NOTE — Progress Notes (Signed)
Patient ID: Pamela Wall, female   DOB: Jan 03, 1981, 34 y.o.   MRN: [id number]   Subjective:    Patient ID: Pamela Wall, female    DOB: 22-May-1981, 34 y.o.   MRN: [id number]  Patient presents for Illness and HTN F/U  patient here to follow-up medications. She has history of hypertension she is currently on amlodipine and is doing well with the medication. All her medical problems surface after she had major complications from what was a gallbladder removal which turned into damage to her liver and other complications. She was seen by her GYN recently and her LFTs were slightly elevated they're having these rechecked in 4 weeks. She was also placed on vitamin D however she needs a refill for next month as she was only given 4 pills. She also requests a refill on her diazepam.  She woke up this morning with severe sore throat swelling in her neck chills low-grade subjective fever. Positive sick contact with her children who have had it respiratory illness over the past week. She has not taken a over-the-counter medications    Review Of Systems:  GEN- denies fatigue, +fever, weight loss,weakness, recent illness HEENT- denies eye drainage, change in vision, nasal discharge, CVS- denies chest pain, palpitations RESP- denies SOB, cough, wheeze ABD- denies N/V, change in stools, abd pain GU- denies dysuria, hematuria, dribbling, incontinence MSK- denies joint pain, muscle aches, injury Neuro- denies headache, dizziness, syncope, seizure activity       Objective:    BP 136/84 mmHg  Pulse 78  Temp(Src) 98.9 F (37.2 C) (Oral)  Resp 16  Ht 5\' 1"  (1.549 m)  Wt 114 lb (51.71 kg)  BMI 21.55 kg/m2  LMP 08/15/1984 GEN- NAD, alert and oriented x3 HEENT- PERRL, EOMI, non injected sclera, pink conjunctiva, MMM, oropharynx injected, mild exudate Neck- Supple, + anterior LAD CVS- RRR, no murmur RESP-CTAB Psych- normal affect and mood Ext- No edema Pulses- Radial - 2+        Assessment  & Plan:      Problem List Items Addressed This Visit      Unprioritized   Insomnia   Relevant Medications   diazepam (VALIUM) tablet   HTN (hypertension)   Relevant Medications   amLODIpine (NORVASC) tablet   Adjustment disorder with anxiety   Relevant Medications   diazepam (VALIUM) tablet    Other Visit Diagnoses    Strep pharyngitis    -  Primary    Positive Strep , treat with amox, diflucan in case of yeast infection. pt prone to them    Relevant Medications    fluconazole (DIFLUCAN) tablet 150 mg    Other Relevant Orders    Rapid Strep Screen (Completed)       Note: This dictation was prepared with Dragon dictation along with smaller phrase technology. Any transcriptional errors that result from this process are unintentional.

## 2015-01-31 NOTE — Assessment & Plan Note (Addendum)
Valium refilled, patient understands how to take this with Flexeril

## 2015-01-31 NOTE — Assessment & Plan Note (Signed)
BP well controlled, norvasc refilled, LFT and renal function noted, repeat in 4 weeks

## 2015-02-04 ENCOUNTER — Telehealth: Payer: Self-pay | Admitting: Family Medicine

## 2015-02-04 NOTE — Telephone Encounter (Signed)
[phone] PT saw Dr Buelah Manis on 3/28 and she is calling because she is needing a note for work She is needing to be written out for 3/21, 3/22, and 3/25 as well as 3/28-4/1.

## 2015-02-04 NOTE — Telephone Encounter (Signed)
Ok to write note

## 2015-02-04 NOTE — Telephone Encounter (Signed)
Call placed to patient and patient made aware per VM.   Letter printed.

## 2015-02-04 NOTE — Telephone Encounter (Signed)
I can only cover 3/28- 4/1 for her illness The other days I can not justify

## 2015-02-07 ENCOUNTER — Ambulatory Visit (INDEPENDENT_AMBULATORY_CARE_PROVIDER_SITE_OTHER): Payer: Medicaid Other | Admitting: Physician Assistant

## 2015-02-07 ENCOUNTER — Encounter: Payer: Self-pay | Admitting: Physician Assistant

## 2015-02-07 ENCOUNTER — Encounter: Payer: Self-pay | Admitting: Family Medicine

## 2015-02-07 VITALS — BP 116/86 | HR 100 | Temp 98.8°F | Resp 18 | Wt 115.0 lb

## 2015-02-07 DIAGNOSIS — B9689 Other specified bacterial agents as the cause of diseases classified elsewhere: Principal | ICD-10-CM

## 2015-02-07 DIAGNOSIS — J988 Other specified respiratory disorders: Secondary | ICD-10-CM

## 2015-02-07 MED ORDER — AZITHROMYCIN 250 MG PO TABS: ORAL_TABLET | ORAL | Status: DC

## 2015-02-08 NOTE — Progress Notes (Signed)
Patient ID: Pamela Wall MRN: [id number], DOB: 09-27-1981, 34 y.o. Date of Encounter: 02/08/2015, 1:12 AM    Chief Complaint:  Chief Complaint  Patient presents with  . not better from last week    much more congested, thick green secretions     HPI: 34 y.o. year old female from 01/31/15. I have reviewed that note. At that visit she reported she woke up that morning with severe sore throat, swelling in her neck, chills, subjective low-grade fever. Sick contact with her children who had respiratory illness over the prior week. Rapid strep test was positive. She treated her with amoxicillin 500 mg twice a day 10 days. Patient states she started the amoxicillin on that day which was 01/31/15. Says that she has been taking it twice daily as directed. States that now her ears are popping and she feels stopped up/congested. Says that she is getting thick green mucus from her nose and also thick green phlegm and feels that this congestion is coming from deep in her chest. Her sore throat has resolved but now has all of these new symptoms. Says temperature was 100 this morning.     Home Meds:   Outpatient Prescriptions Prior to Visit  Medication Sig Dispense Refill  . amitriptyline (ELAVIL) 50 MG tablet Take 50 mg by mouth at bedtime.    Marland Kitchen amLODipine (NORVASC) 5 MG tablet TAKE (1) TABLET BY MOUTH ONCE DAILY. 30 tablet 6  . amoxicillin (AMOXIL) 500 MG capsule Take 1 capsule (500 mg total) by mouth 2 (two) times daily. 20 capsule 0  . cyclobenzaprine (FLEXERIL) 10 MG tablet Take 10 mg by mouth 3 (three) times daily as needed.    . diazepam (VALIUM) 2 MG tablet Take 1 tablet (2 mg total) by mouth every 6 (six) hours as needed for anxiety. 30 tablet 0  . fluconazole (DIFLUCAN) 150 MG tablet Take 1 tablet (150 mg total) by mouth once. 1 tablet 1  . oxyCODONE (ROXICODONE) 15 MG immediate release tablet Take 15 mg by mouth every 4 (four) hours as needed. Pt takes 5 times a day    . Vitamin D,  Ergocalciferol, (DRISDOL) 50000 UNITS CAPS capsule Take 1 capsule (50,000 Units total) by mouth every 7 (seven) days. 4 capsule 1   No facility-administered medications prior to visit.    Allergies:  Allergies  Allergen Reactions  . Dog Epithelium Itching    Also Cat dander and Horse dander. Patient takes zyrtec as prophylactic.       Review of Systems: See HPI for pertinent ROS. All other ROS negative.    Physical Exam: Blood pressure 116/86, pulse 100, temperature 98.8 F (37.1 C), temperature source Oral, resp. rate 18, weight 115 lb (52.164 kg), last menstrual period 08/15/1984., Body mass index is 21.74 kg/(m^2). General: WNWD WF.  Appears in no acute distress. HEENT: Normocephalic, atraumatic, eyes without discharge, sclera non-icteric, nares are without discharge. Bilateral auditory canals clear, TM's are without perforation, pearly grey and translucent with reflective cone of light bilaterally. Oral cavity moist, posterior pharynx without exudate, erythema, peritonsillar abscess.  Neck: Supple. No thyromegaly. No lymphadenopathy. Lungs: Clear bilaterally to auscultation without wheezes, rales, or rhonchi. Breathing is unlabored. Heart: Regular rhythm. No murmurs, rubs, or gallops. Msk:  Strength and tone normal for age. Extremities/Skin: Warm and dry.  Neuro: Alert and oriented X 3. Moves all extremities spontaneously. Gait is normal. CNII-XII grossly in tact. Psych:  Responds to questions appropriately with a normal affect.  ASSESSMENT AND PLAN:  34 y.o. year old female with  1. Bacterial respiratory infection She is to take this antibiotic as directed and complete it. If symptoms do not resolve within 1 week after completion, follow up. - azithromycin (ZITHROMAX) 250 MG tablet; Day 1: Take 2 daily.  Days 2-5: Take 1 daily.  Dispense: 6 tablet; Refill: 0   Signed, [address] [place], [place], [facility] 02/08/2015 1:12 AM

## 2015-02-10 ENCOUNTER — Encounter: Payer: Self-pay | Admitting: Family Medicine

## 2015-02-10 ENCOUNTER — Ambulatory Visit (INDEPENDENT_AMBULATORY_CARE_PROVIDER_SITE_OTHER): Payer: Medicaid Other | Admitting: Physician Assistant

## 2015-02-10 ENCOUNTER — Encounter: Payer: Self-pay | Admitting: Physician Assistant

## 2015-02-10 VITALS — BP 114/86 | HR 104 | Temp 98.5°F | Resp 18 | Wt 111.0 lb

## 2015-02-10 DIAGNOSIS — R112 Nausea with vomiting, unspecified: Secondary | ICD-10-CM | POA: Diagnosis not present

## 2015-02-10 LAB — CBC W/MCH & 3 PART DIFF
HCT: 41 % (ref 36.0–46.0)
Hemoglobin: 14.8 g/dL (ref 12.0–15.0)
LYMPHS ABS: 3.8 10*3/uL (ref 0.7–4.0)
Lymphocytes Relative: 38 % (ref 12–46)
MCH: 31 pg (ref 26.0–34.0)
MCHC: 36.1 g/dL — AB (ref 30.0–36.0)
MCV: 85.8 fL (ref 78.0–100.0)
NEUTROS PCT: 58 % (ref 43–77)
Neutro Abs: 5.8 10*3/uL (ref 1.7–7.7)
PLATELETS: 369 10*3/uL (ref 150–400)
RBC: 4.78 MIL/uL (ref 3.87–5.11)
RDW: 12.3 % (ref 11.5–15.5)
WBC mixed population %: 4 % (ref 3–18)
WBC mixed population: 0.4 10*3/uL (ref 0.1–1.8)
WBC: 10 10*3/uL (ref 4.0–10.5)

## 2015-02-10 MED ORDER — PROMETHAZINE HCL 25 MG RE SUPP: 25.0000 mg | Freq: Four times a day (QID) | RECTAL | Status: DC | PRN

## 2015-02-10 MED ORDER — PROMETHAZINE HCL 25 MG PO TABS: 25.0000 mg | ORAL_TABLET | Freq: Three times a day (TID) | ORAL | Status: DC | PRN

## 2015-02-10 NOTE — Progress Notes (Signed)
Patient ID: Pamela Wall MRN: [id number], DOB: 1981/08/07, 34 y.o. Date of Encounter: 02/10/2015, 5:02 PM    Chief Complaint:  Chief Complaint  Patient presents with  . has been vomiting since yesterday     HPI: 34 y.o. year old white female says that she started vomiting last night at around 8 or 8:30. Says that she has continued to vomit repeatedly. Says even if she says takes just a sip of water she vomits. Has had no diarrhea. Only area where she is feeling pain is just up in her left upper abdomen towards her stomach. No other area of localized/focal abdominal pain. Says that in the past Zofran did not work for her but Phenergan works very well for her. Says that she is not feeling lightheaded or presyncope --does not feel that she is dehydrated.     Home Meds:   Outpatient Prescriptions Prior to Visit  Medication Sig Dispense Refill  . amitriptyline (ELAVIL) 50 MG tablet Take 50 mg by mouth at bedtime.    Marland Kitchen amLODipine (NORVASC) 5 MG tablet TAKE (1) TABLET BY MOUTH ONCE DAILY. 30 tablet 6  . azithromycin (ZITHROMAX) 250 MG tablet Day 1: Take 2 daily.  Days 2-5: Take 1 daily. 6 tablet 0  . cyclobenzaprine (FLEXERIL) 10 MG tablet Take 10 mg by mouth 3 (three) times daily as needed.    . diazepam (VALIUM) 2 MG tablet Take 1 tablet (2 mg total) by mouth every 6 (six) hours as needed for anxiety. 30 tablet 0  . fluconazole (DIFLUCAN) 150 MG tablet Take 1 tablet (150 mg total) by mouth once. 1 tablet 1  . oxyCODONE (ROXICODONE) 15 MG immediate release tablet Take 15 mg by mouth every 4 (four) hours as needed. Pt takes 5 times a day    . Vitamin D, Ergocalciferol, (DRISDOL) 50000 UNITS CAPS capsule Take 1 capsule (50,000 Units total) by mouth every 7 (seven) days. 4 capsule 1  . amoxicillin (AMOXIL) 500 MG capsule Take 1 capsule (500 mg total) by mouth 2 (two) times daily. 20 capsule 0   No facility-administered medications prior to visit.    Allergies:  Allergies  Allergen  Reactions  . Dog Epithelium Itching    Also Cat dander and Horse dander. Patient takes zyrtec as prophylactic.       Review of Systems: See HPI for pertinent ROS. All other ROS negative.    Physical Exam: Blood pressure 114/86, pulse 104, temperature 98.5 F (36.9 C), temperature source Oral, resp. rate 18, weight 111 lb (50.349 kg), last menstrual period 08/15/1984., Body mass index is 20.98 kg/(m^2). General: Petite WF. Appears in no acute distress. Neck: Supple. No thyromegaly. No lymphadenopathy. Lungs: Clear bilaterally to auscultation without wheezes, rales, or rhonchi. Breathing is unlabored. Heart: Regular rhythm. No murmurs, rubs, or gallops. Abdomen: She has scars from prior abdominal surgeries. The only area that is tender on exam currently is her left upper quadrant. No other areas of significant tenderness. No tenderness in the right lower quadrant at site of appendix.  Msk:  Strength and tone normal for age. Extremities/Skin: Warm and dry. Neuro: Alert and oriented X 3. Moves all extremities spontaneously. Gait is normal. CNII-XII grossly in tact. Psych:  Responds to questions appropriately with a normal affect.   Results for orders placed or performed in visit on 02/10/15  COMPLETE METABOLIC PANEL WITH GFR  Result Value Ref Range   Sodium 135 135 - 145 mEq/L   Potassium 3.7 3.5 - 5.3  mEq/L   Chloride 91 (L) 96 - 112 mEq/L   CO2 33 (H) 19 - 32 mEq/L   Glucose, Bld 104 (H) 70 - 99 mg/dL   BUN 14 6 - 23 mg/dL   Creat 1.13 (H) 0.50 - 1.10 mg/dL   Total Bilirubin 0.4 0.2 - 1.2 mg/dL   Alkaline Phosphatase 126 (H) 39 - 117 U/L   AST 28 0 - 37 U/L   ALT 28 0 - 35 U/L   Total Protein 7.5 6.0 - 8.3 g/dL   Albumin 4.4 3.5 - 5.2 g/dL   Calcium 9.1 8.4 - 10.5 mg/dL   GFR, Est African American 74 mL/min   GFR, Est Non African American 64 mL/min  CBC w/MCH & 3 Part Diff  Result Value Ref Range   WBC 10.0 4.0 - 10.5 K/uL   RBC 4.78 3.87 - 5.11 MIL/uL   Hemoglobin 14.8  12.0 - 15.0 g/dL   HCT 41.0 36.0 - 46.0 %   MCV 85.8 78.0 - 100.0 fL   MCH 31.0 26.0 - 34.0 pg   MCHC 36.1 (H) 30.0 - 36.0 g/dL   RDW 12.3 11.5 - 15.5 %   Platelets 369 150 - 400 K/uL   Neutrophils Relative % 58 43 - 77 %   Neutro Abs 5.8 1.7 - 7.7 K/uL   Lymphocytes Relative 38 12 - 46 %   Lymphs Abs 3.8 0.7 - 4.0 K/uL   WBC mixed population % 4 3 - 18 %   WBC mixed population 0.4 0.1 - 1.8 K/uL     ASSESSMENT AND PLAN:  34 y.o. year old female with  1. Non-intractable vomiting with nausea, vomiting of unspecified type  - COMPLETE METABOLIC PANEL WITH GFR - CBC w/MCH & 3 Part Diff  I reviewed CBC with pt here in office--CMET was sent off and results received/reviewed the following day.  WBC normal. Suspect viral gastritis.  She is to use the Phenergan as directed below.  She is to f/u if vomiting does not decrease/resolve, if she is feeling lightheaded/dehydrated, or if develops area of localized abdominal pain.  Pt voices understanding, agrees.  Given her level of vomiting, I am concerned whether she can even keep down pill form of Phenergan. Discussed IM Phenergan but she is driving herself and lives over 30 minutes away so we cannot do that. Give her some Phenergan suppositories to use  initially to get the nausea controlled then she can use the oral Phenergan thereafter. - promethazine (PHENERGAN) 25 MG tablet; Take 1 tablet (25 mg total) by mouth every 8 (eight) hours as needed for nausea or vomiting.  Dispense: 20 tablet; Refill: 0 - promethazine (PHENERGAN) 25 MG suppository; Place 1 suppository (25 mg total) rectally every 6 (six) hours as needed for nausea or vomiting.  Dispense: 12 each; Refill: 0   Signed, [address] [place], [place], [facility] 02/10/2015 5:02 PM

## 2015-02-11 ENCOUNTER — Telehealth: Payer: Self-pay | Admitting: Family Medicine

## 2015-02-11 LAB — COMPLETE METABOLIC PANEL WITH GFR
ALBUMIN: 4.4 g/dL (ref 3.5–5.2)
ALT: 28 U/L (ref 0–35)
AST: 28 U/L (ref 0–37)
Alkaline Phosphatase: 126 U/L — ABNORMAL HIGH (ref 39–117)
BUN: 14 mg/dL (ref 6–23)
CHLORIDE: 91 meq/L — AB (ref 96–112)
CO2: 33 mEq/L — ABNORMAL HIGH (ref 19–32)
Calcium: 9.1 mg/dL (ref 8.4–10.5)
Creat: 1.13 mg/dL — ABNORMAL HIGH (ref 0.50–1.10)
GFR, EST AFRICAN AMERICAN: 74 mL/min
GFR, EST NON AFRICAN AMERICAN: 64 mL/min
Glucose, Bld: 104 mg/dL — ABNORMAL HIGH (ref 70–99)
Potassium: 3.7 mEq/L (ref 3.5–5.3)
Sodium: 135 mEq/L (ref 135–145)
TOTAL PROTEIN: 7.5 g/dL (ref 6.0–8.3)
Total Bilirubin: 0.4 mg/dL (ref 0.2–1.2)

## 2015-02-11 NOTE — Telephone Encounter (Signed)
-----   Message from Orlena Sheldon, PA-C sent at 02/11/2015 11:17 AM EDT ----- Discussed CBC with pt at [facility]. CMET stable.  See if N/V are improved.

## 2015-02-11 NOTE — Telephone Encounter (Signed)
Pt aware of lab results and provider recommendations.  Pt is starting to feel better.

## 2015-03-04 ENCOUNTER — Telehealth: Payer: Self-pay | Admitting: *Deleted

## 2015-03-04 DIAGNOSIS — F4322 Adjustment disorder with anxiety: Secondary | ICD-10-CM

## 2015-03-04 DIAGNOSIS — G4700 Insomnia, unspecified: Secondary | ICD-10-CM

## 2015-03-04 NOTE — Telephone Encounter (Signed)
Received fax requesting refill on Diazepam.   Ok to refill??  Last office visit 02/10/2015.  Last refill 01/31/2015.

## 2015-03-04 NOTE — Telephone Encounter (Signed)
I would defer to her PCP.  I have never seen her.

## 2015-03-07 MED ORDER — DIAZEPAM 2 MG PO TABS: 2.0000 mg | ORAL_TABLET | Freq: Four times a day (QID) | ORAL | Status: DC | PRN

## 2015-03-07 NOTE — Telephone Encounter (Signed)
Medication called to pharmacy. 

## 2015-03-07 NOTE — Telephone Encounter (Signed)
Okay to refill 30 tabs

## 2015-05-04 ENCOUNTER — Other Ambulatory Visit: Payer: Self-pay | Admitting: Family Medicine

## 2015-05-04 DIAGNOSIS — G4700 Insomnia, unspecified: Secondary | ICD-10-CM

## 2015-05-04 DIAGNOSIS — F4322 Adjustment disorder with anxiety: Secondary | ICD-10-CM

## 2015-05-04 MED ORDER — DIAZEPAM 2 MG PO TABS: 2.0000 mg | ORAL_TABLET | Freq: Four times a day (QID) | ORAL | Status: DC | PRN

## 2015-05-04 NOTE — Telephone Encounter (Signed)
Medication refilled per protocol. 

## 2015-05-04 NOTE — Telephone Encounter (Signed)
Approved. # 30 + 0. 

## 2015-05-04 NOTE — Telephone Encounter (Signed)
LRF 03/07/15 #30.  LOV 02/10/15  OK refill?

## 2015-06-18 ENCOUNTER — Other Ambulatory Visit: Payer: Self-pay | Admitting: Physician Assistant

## 2015-06-20 ENCOUNTER — Other Ambulatory Visit: Payer: Self-pay | Admitting: Physician Assistant

## 2015-06-20 DIAGNOSIS — F4322 Adjustment disorder with anxiety: Secondary | ICD-10-CM

## 2015-06-20 DIAGNOSIS — G4700 Insomnia, unspecified: Secondary | ICD-10-CM

## 2015-06-20 MED ORDER — DIAZEPAM 2 MG PO TABS: 2.0000 mg | ORAL_TABLET | Freq: Four times a day (QID) | ORAL | Status: DC | PRN

## 2015-06-21 ENCOUNTER — Other Ambulatory Visit: Payer: Self-pay | Admitting: *Deleted

## 2015-06-21 DIAGNOSIS — R112 Nausea with vomiting, unspecified: Secondary | ICD-10-CM

## 2015-06-21 MED ORDER — PROMETHAZINE HCL 25 MG PO TABS: 25.0000 mg | ORAL_TABLET | Freq: Three times a day (TID) | ORAL | Status: DC | PRN

## 2015-08-02 ENCOUNTER — Other Ambulatory Visit: Payer: Self-pay | Admitting: Family Medicine

## 2015-08-02 DIAGNOSIS — F4322 Adjustment disorder with anxiety: Secondary | ICD-10-CM

## 2015-08-02 DIAGNOSIS — G4700 Insomnia, unspecified: Secondary | ICD-10-CM

## 2015-08-02 NOTE — Telephone Encounter (Signed)
LRF 06/18/15 #30  LOV for this problem 01/31/15  OK refill?

## 2015-08-03 MED ORDER — DIAZEPAM 2 MG PO TABS: 2.0000 mg | ORAL_TABLET | Freq: Four times a day (QID) | ORAL | Status: DC | PRN

## 2015-08-03 NOTE — Telephone Encounter (Signed)
Approved for #30+2 additional refills. Note that each Rx needs to last full 30 days.

## 2015-08-03 NOTE — Telephone Encounter (Signed)
Medication refilled per protocol. 

## 2015-08-30 ENCOUNTER — Other Ambulatory Visit: Payer: Self-pay | Admitting: Family Medicine

## 2015-08-30 ENCOUNTER — Other Ambulatory Visit: Payer: Self-pay | Admitting: Physician Assistant

## 2015-08-31 ENCOUNTER — Encounter: Payer: Self-pay | Admitting: Family Medicine

## 2015-08-31 NOTE — Telephone Encounter (Signed)
Medication refill for one time only.  Patient needs to be seen.  Letter sent for patient to call and schedule 

## 2015-08-31 NOTE — Telephone Encounter (Signed)
Medication refilled per protocol. 

## 2015-10-04 ENCOUNTER — Encounter: Payer: Self-pay | Admitting: Family Medicine

## 2015-10-04 ENCOUNTER — Other Ambulatory Visit: Payer: Self-pay | Admitting: Physician Assistant

## 2015-10-04 NOTE — Telephone Encounter (Signed)
Medication refill for one time only.  Patient needs to be seen.  Letter sent for patient to call and schedule 

## 2015-11-11 ENCOUNTER — Other Ambulatory Visit: Payer: Self-pay | Admitting: Physician Assistant

## 2015-11-11 NOTE — Telephone Encounter (Signed)
Medication refill for one time only.  Patient needs to be seen.  Letter sent for patient to call and schedule 

## 2015-11-14 ENCOUNTER — Encounter: Payer: Self-pay | Admitting: Family Medicine

## 2015-11-22 ENCOUNTER — Other Ambulatory Visit: Payer: Self-pay | Admitting: Family Medicine

## 2015-11-22 DIAGNOSIS — G4700 Insomnia, unspecified: Secondary | ICD-10-CM

## 2015-11-22 DIAGNOSIS — F4322 Adjustment disorder with anxiety: Secondary | ICD-10-CM

## 2015-11-22 NOTE — Telephone Encounter (Signed)
LRF 08/03/15 #30 + 2   LOV 02/10/15.  Over due for 6 mth follow up.  Has been sent 3 letters.

## 2015-11-23 NOTE — Telephone Encounter (Signed)
Diazepam refill denied until pt makes appt.

## 2015-11-23 NOTE — Telephone Encounter (Signed)
Needs OV prior to further refills. 

## 2015-12-20 ENCOUNTER — Other Ambulatory Visit: Payer: Self-pay | Admitting: Physician Assistant

## 2015-12-21 ENCOUNTER — Encounter: Payer: Self-pay | Admitting: Family Medicine

## 2015-12-21 NOTE — Telephone Encounter (Signed)
Pt has been sent 3 letters telling her she needs to be seen.  Amlodipine denied.

## 2015-12-28 ENCOUNTER — Other Ambulatory Visit: Payer: Self-pay | Admitting: Women's Health

## 2015-12-28 ENCOUNTER — Other Ambulatory Visit: Payer: Self-pay | Admitting: Physician Assistant

## 2015-12-28 NOTE — Telephone Encounter (Signed)
Refill appropriate and filled per protocol. 

## 2015-12-30 ENCOUNTER — Other Ambulatory Visit: Payer: Self-pay | Admitting: Physician Assistant

## 2015-12-30 ENCOUNTER — Other Ambulatory Visit: Payer: Self-pay | Admitting: Family Medicine

## 2015-12-30 DIAGNOSIS — F4322 Adjustment disorder with anxiety: Secondary | ICD-10-CM

## 2015-12-30 DIAGNOSIS — G4700 Insomnia, unspecified: Secondary | ICD-10-CM

## 2015-12-30 NOTE — Telephone Encounter (Signed)
Refill appropriate and filled per protocol. 

## 2015-12-30 NOTE — Telephone Encounter (Signed)
LRF 08/03/15 #30 + 2   LOV sick 02/10/15  LROV 01/31/15  Was to follow up in 6 months.  Has been sent several letters to make follow up appt.  Has not.  Refill diazepam denied.

## 2015-12-30 NOTE — Telephone Encounter (Signed)
Agree. Refill DENIED. Must have OV prior to any further meds/refills.

## 2016-01-03 ENCOUNTER — Other Ambulatory Visit: Payer: Self-pay | Admitting: Family Medicine

## 2016-01-03 DIAGNOSIS — G4700 Insomnia, unspecified: Secondary | ICD-10-CM

## 2016-01-03 DIAGNOSIS — F4322 Adjustment disorder with anxiety: Secondary | ICD-10-CM

## 2016-01-03 NOTE — Telephone Encounter (Signed)
Valium denied until pt makes appt.

## 2016-01-03 NOTE — Telephone Encounter (Signed)
Diazepam refill denied again.  Pt needs to be seen and has not responded to multiple letters to make appt.

## 2016-01-09 ENCOUNTER — Other Ambulatory Visit: Payer: Self-pay | Admitting: Physician Assistant

## 2016-01-09 DIAGNOSIS — G4700 Insomnia, unspecified: Secondary | ICD-10-CM

## 2016-01-09 DIAGNOSIS — F4322 Adjustment disorder with anxiety: Secondary | ICD-10-CM

## 2016-01-09 NOTE — Telephone Encounter (Signed)
No refills on medications until appt is made.

## 2016-01-09 NOTE — Telephone Encounter (Signed)
No refills until appt made per provider

## 2016-01-27 ENCOUNTER — Other Ambulatory Visit: Payer: Self-pay

## 2016-01-27 NOTE — Telephone Encounter (Signed)
Please call Pamela Wall and have her schedule annual exam. Okay to call in refill

## 2016-02-27 ENCOUNTER — Other Ambulatory Visit: Payer: Self-pay | Admitting: Women's Health

## 2016-02-27 ENCOUNTER — Other Ambulatory Visit: Payer: Self-pay | Admitting: Family Medicine

## 2016-02-27 NOTE — Telephone Encounter (Signed)
12 week course completed.   Refill denied.   Requires office visit before any further refills can be given.

## 2016-03-07 ENCOUNTER — Other Ambulatory Visit: Payer: Self-pay | Admitting: Women's Health

## 2016-03-08 ENCOUNTER — Other Ambulatory Visit: Payer: Self-pay

## 2016-03-12 ENCOUNTER — Other Ambulatory Visit: Payer: Self-pay | Admitting: Women's Health

## 2016-03-15 ENCOUNTER — Telehealth: Payer: Self-pay | Admitting: *Deleted

## 2016-03-15 MED ORDER — ESTROGENS CONJUGATED 0.3 MG PO TABS: ORAL_TABLET | ORAL | Status: DC

## 2016-03-15 NOTE — Telephone Encounter (Signed)
Pt has annual scheduled on 05/03/16 need refill on premarin 0.3 mg Rx sent.

## 2016-05-03 ENCOUNTER — Ambulatory Visit: Payer: Self-pay | Admitting: Women's Health

## 2016-05-03 DIAGNOSIS — Z0289 Encounter for other administrative examinations: Secondary | ICD-10-CM

## 2016-05-17 ENCOUNTER — Telehealth: Payer: Self-pay | Admitting: *Deleted

## 2016-05-17 ENCOUNTER — Encounter: Payer: Self-pay | Admitting: Physician Assistant

## 2016-05-17 ENCOUNTER — Ambulatory Visit (INDEPENDENT_AMBULATORY_CARE_PROVIDER_SITE_OTHER): Payer: Medicaid Other | Admitting: Physician Assistant

## 2016-05-17 VITALS — BP 132/98 | HR 100 | Temp 98.1°F | Resp 18 | Wt 113.0 lb

## 2016-05-17 DIAGNOSIS — R197 Diarrhea, unspecified: Secondary | ICD-10-CM | POA: Diagnosis not present

## 2016-05-17 DIAGNOSIS — G894 Chronic pain syndrome: Secondary | ICD-10-CM

## 2016-05-17 DIAGNOSIS — G43A0 Cyclical vomiting, not intractable: Secondary | ICD-10-CM | POA: Diagnosis not present

## 2016-05-17 MED ORDER — OXYCODONE HCL 15 MG PO TABS: 15.0000 mg | ORAL_TABLET | ORAL | Status: DC | PRN

## 2016-05-17 MED ORDER — PROMETHAZINE HCL 25 MG PO TABS: ORAL_TABLET | ORAL | Status: DC

## 2016-05-17 NOTE — Progress Notes (Addendum)
Patient ID: BRITTON KOSTIUK MRN: [id number], DOB: 12-Apr-1981, 35 y.o. Date of Encounter: @DATE @  Chief Complaint:  Chief Complaint  Patient presents with  . stomach issues    s/p gall bladder removal 2 yrs ago  still having issues    HPI: 35 y.o. year old white female  Presents with above.     THE FOLLOWING WAS DOCUMENTED AT HER OV NOTE WITH ME ON---- 04/12/2014:   She says that she was went to [facility] for a laparoscopic cholecystectomy. She was to have the procedure and go home the next day. However apparently once they got in, they found that her gallbladder was behind the liver and was adhered to the liver. They ended up doing an open cholecystectomy and patient says that they punctured her liver. Says that she was in [place] from 02/12/2014 - 02/19/14 and was transferred to [facility] from there. Was at [facility] from 4/17 through 4/24. She is scheduled for her next procedure 04/26/14--this is to place a stent in the bile duct. She also knows that she will have to have at least one other procedure to remove the current stent that she already has placed. As well she thinks that she has an incisional hernia. She and her grandmother report that the incision site looked like it does now as soon as they removed the staples -- this was done at [place] that she is seeing a Dr. Carlis Abbott, Dr. Amalia Hailey, Dr. Penelope Galas at [facility]. Also seeing a doctor [facility]--pain management. Says that she had an MRI last week and has an appointment to see Dr. Carlis Abbott 6/22 prior to the stent procedure.  Says that Dr. Greta Doom at the pain clinic is prescribing the cyclobenzaprine and oxycodone.  Says one of the doctors at [facility] has been prescribing the Valium. However she has been unable to get in touch with him to get refills. Says the 10 mg is too strong and " knocks her out". Says a half of a pill (5 mg) works perfectly. As the prior to all of this she never had any problems with anxiety. Also never had  problems with insomnia. Currently he is prescribed amitriptyline for the insomnia. This is being prescribed by Dr. Greta Doom the pain clinic. Says prior to the amitriptyline she wasn't getting any sleep. With the amitriptyline, she is able to get 4 hours of sleep but that is all.  She says that prior to this she was very active and functional.  She has 2 children ages 33 and age  33.  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 She and her kids are staying with her grandmother who is with her at the visit today.   THE FOLLOWING IS FROM HER OV NOTE WITH ME ----05/20/2014:   Today she she reports that when she went for followup at [facility] that they were able to remove the stent and did not require any further surgeries. Also says that they felt the protrusion at the right upper quadrant may be secondary to layer of adipose tissue which was not pressed down firmly prior to the sutures being placed-- or  felt there could possibly be a small area of hernia -- plan to just monitor this for now. Patient states that they went down her throat to remove the stent. Says that she has had no further abdominal surgeries since her last visit with me.  However, for some reason, she developed  swelling of both of her legs from July 13 -  July 15. She says that both her ankles and knees appeared swollen as big as her calves. However last night she elevated her legs and applied ice and this morning the swelling has resolved.  Also, she says that she is trying to wean down the diazepam but is wondering is she can get a refill. Says that she does still have times of feeling as if she is "shaking on the inside "and also having difficulty sleeping.   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  She had a hysterectomy secondary to cancer at age 33 months old months old. She says that she also had radiation therapy. It was  later documented that the radiation had "killed her ovaries". Says that she was on hormone replacement therapy to go through puberty. Later after that the hormone replacement therapy was used off and on. Says that when she was in her 35s she experienced hot flashes so they treated this with Premarin and Estratest. Says that they wanted to limit the amount of hormones that she was on so was eventually discontinued. Says that she went for years without any menopausal symptoms. However she says that she is now having significant sweats at night--even soaking her bed sheets--says that these are the exact same symptoms that she had had in the past. She recently called here about referral to GYN which I had approved. She is wanting to go ahead and get on some hormones to use while she waits for that appointment.  She also reports that her blood pressure has been a little bit high when she has gone to pain management recently. He says that back before this recent surgery her blood pressure used to be low in the 100s over 70s. Says that now it is about 145/100 every time she goes to pain management.  She is also requesting refill on Valium but wants to decrease the dose and just wants to have some available if needed.   ---05/17/2016: She has multiple issues to address at today's visit: --- Needs referral to a new GI doctor: Says that she is now in the midst of legal litigation and needs a new GI doctor to help manage her current symptoms. Says that she is having subclinical episodes of it being as if her GI system gets totally stopped it and then suddenly goes. Is that during that time where it feels like everything stops she projectile vomits. An once things start to move again she has diarrhea. Says that these cycles were occurring every couple of months then every month and then every 2 weeks and are now getting more and more frequent. Says that she stopped gluten for a period of time hoping that  would help. Kept a food diary. Nohing has helped and she continues with this cycle. Also needs a refill on her Phenergan to use.  ----Needs referral to a new pain management.: Says that she was going to [facility] and most recently had been seeing Dr. Greta Doom. Says that her last office visit there she was informed that they no longer take Medicaid and can no longer prescribe her any further medications and provide any further care. Says that she is very upset because she has been seeing that same pain clinic for about 10 years and they know her very well. At 72 months of age she was found to have a very  rare type of cancer which she says was managed at [facility] that she had hysterectomy chemotherapy and radiation therapy at that time. Because of the growth that occurred in her body after this treatment and the effects of this treatment, her body is not level and symmetrical and therefore has chronic pain in her back and abdomen.   She states that she still has one refill remaining on her Elavil and one refill remaining on her Flexeril. Does need prescription for oxycodone. She has her last bottle with her which reads take 1 pill 5 times a day and was for #150 and was dispensed on 04/20/16.  Today she tells me that she has been raising her sister's children. She has pictures of them on her phone that she shows me. She also tells me that she has gotten her "dream job and loves it " Is working at [employer]. However says that she needs to make sure that she keeps that job and does not lose that job because of these GI symptoms and also that it is embarrassing that she is having these GI symptoms. Says at the end of September she will be eligible to get on her company's insurance and may not have to have Medicaid after that point. However says that DSS informed her that because she has 3 dependents she may still requalify for Medicaid-- she is not  sure. She is in very good spirits today.  Past Medical History  Diagnosis Date  . Scoliosis   . Endodermal sinus tumor   . Osteopenia   . Enterocolitis   . 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 Depression     history - no meds  . Asthma     childhood - no prob as adult  . Cancer [facility])     endometrial  sinus tumor  . Back pain      Home Meds: Outpatient Prescriptions Prior to Visit  Medication Sig Dispense Refill  . cyclobenzaprine (FLEXERIL) 10 MG tablet Take 10 mg by mouth 3 (three) times daily as needed.    Marland Kitchen estrogens, conjugated, (PREMARIN) 0.3 MG tablet TAKE (1) TABLET BY MOUTH ONCE DAILY. 30 tablet 1  . promethazine (PHENERGAN) 25 MG suppository Place 1 suppository (25 mg total) rectally every 6 (six) hours as needed for nausea or vomiting. 12 each 0  . oxyCODONE (ROXICODONE) 15 MG immediate release tablet Take 15 mg by mouth every 4 (four) hours as needed. Pt takes 5 times a day    . promethazine (PHENERGAN) 25 MG tablet TAKE 1 TABLET BY MOUTH EVERY 8 HOURS AS NEEDED FOR NAUSEA AND VOMITING. 20 tablet 0  . amitriptyline (ELAVIL) 50 MG tablet Take 50 mg by mouth at bedtime. Reported on 05/17/2016    . amLODipine (NORVASC) 5 MG tablet TAKE 1 TABLET ONCE DAILY. (Patient not taking: Reported on 05/17/2016) 30 tablet 0  . diazepam (VALIUM) 2 MG tablet Take 1 tablet (2 mg total) by mouth every 6 (six) hours as needed for anxiety (refill must last 30 days). (Patient not taking: Reported on 05/17/2016) 30 tablet 2  . azithromycin (ZITHROMAX) 250 MG tablet Day 1: Take 2 daily.  Days 2-5: Take 1 daily. 6 tablet 0  . fluconazole (DIFLUCAN) 150 MG tablet Take 1 tablet (150 mg total) by mouth once. 1 tablet 1  . Vitamin D, Ergocalciferol, (DRISDOL) 50000 UNITS CAPS capsule Take 1 capsule (50,000 Units total) by mouth  every 7 (seven) days. 4 capsule 1   No facility-administered medications prior to visit.    Allergies:  Allergies  Allergen  Reactions  . Dog Epithelium Itching    Also Cat dander and Horse dander. Patient takes zyrtec as prophylactic.     Social History   Social History  . Marital Status: Single    Spouse Name: N/A  . Number of Children: N/A  . Years of Education: N/A   Occupational History  . Not on file.   Social History Main Topics  . Smoking status: Current Every Day Smoker -- 0.25 packs/day for 10 years    Types: E-cigarettes  . Smokeless tobacco: Never Used     Comment: vapor cigs  . Alcohol Use: Yes     Comment: socially  . Drug Use: No  . Sexual Activity: Yes    Birth Control/ Protection: Surgical   Other Topics Concern  . Not on file   Social History Narrative    Family History  Problem Relation Age of Onset  . Diabetes Father   . Breast cancer Paternal Grandmother   . Diabetes Paternal Grandmother      Review of Systems:  See HPI for pertinent ROS. All other ROS negative.    Physical Exam: Blood pressure 132/98, pulse 100, temperature 98.1 F (36.7 C), temperature source Oral, resp. rate 18, weight 113 lb (51.256 kg), last menstrual period 08/15/1984., Body mass index is 21.36 kg/(m^2). General: WNWD WF. Appears in no acute distress. Neck: Supple. No thyromegaly. No lymphadenopathy. Lungs: Clear bilaterally to auscultation without wheezes, rales, or rhonchi. Breathing is unlabored. Heart: RRR with S1 S2. No murmurs, rubs, or gallops. Musculoskeletal:  Strength and tone normal for age. Extremities/Skin: Warm and dry.  She has scars on her right low back and scars on her abdomen.  Neuro: Alert and oriented X 3. Moves all extremities spontaneously. Gait is normal. CNII-XII grossly in tact. Psych:  Responds to questions appropriately with a normal affect.     ASSESSMENT AND PLAN:  35 y.o. year old female with   1. Cyclical vomiting with nausea, intractability of vomiting not specified - Ambulatory referral to Gastroenterology Refilled her Phenergan today as well.  2.  Diarrhea, unspecified type - Ambulatory referral to Gastroenterology  3. Chronic pain syndrome - Ambulatory referral to Pain Clinic - oxyCODONE (ROXICODONE) 15 MG immediate release tablet; Take 1 tablet (15 mg total) by mouth every 4 (four) hours as needed. Pt takes 5 times a day  Dispense: 150 tablet; Refill: 0 Call a few days in advance of when she needs her next refill. I will refill this medication until she gets in with a new pain clinic. In addition to this medication the pain clinic was also prescribing her Elavil and Flexeril. She currently has one refill on each of these but I will refill these when she needs these in the future until she gets in with a pain clinic.  ADDENDUM ADDED 06/25/2016: Received records from Pain Management Dr. Franco Collet OV note dated 03/22/2016. It is a normal note.  Makes no mention of patient misusing medications.  At that [facility] she was given Rx for oxycodone 15mg  -- take 1 tablet by mouth 5 times a day as needed for pain   # 150 + 0.  That note states "patient is instructed to follow-up in one month for pain reevaluation or sooner as needed"  Note dated 02/23/16 includes the following information --- "urine drug screen obtained at the last visit was  consistent. No aberrent behavior noted"  Signed, [address] [place], [place], [facility] 05/17/2016 12:55 PM

## 2016-05-17 NOTE — Telephone Encounter (Signed)
Received call from [place] with [facility].   Reports that pt brought in new prescription for Oxycodone today. States that prescription was last filled on 04/20/2016, and therefore is not due at this time.   Prescription was not filled. Patient states that she is out of medication. Patient took prescription with her from pharmacy to attempt to find another pharmacy to fill.   PCP to be made aware.

## 2016-05-18 ENCOUNTER — Encounter: Payer: Self-pay | Admitting: Family Medicine

## 2016-06-12 ENCOUNTER — Other Ambulatory Visit: Payer: Self-pay | Admitting: Family Medicine

## 2016-06-12 DIAGNOSIS — G894 Chronic pain syndrome: Secondary | ICD-10-CM

## 2016-06-12 NOTE — Telephone Encounter (Signed)
Calling to check on GI referral.  GI unable to reach pt due to invalid phone numbers.  Phone numbers updated.  Also still has not heard form pain management, needs refill oxycodone.  LRF 05/17/16 #150  OK refill?

## 2016-06-13 NOTE — Telephone Encounter (Signed)
Needs to go back to the #180/month.  She takes 6 doses a day.

## 2016-06-13 NOTE — Telephone Encounter (Signed)
Please contact [facility]--Dr. Bodea---get copy of their last note.  Find out date and quantity and strength of the last Rxes they Rxed.  Also print Drug Registry on her.

## 2016-06-14 ENCOUNTER — Other Ambulatory Visit: Payer: Self-pay | Admitting: Women's Health

## 2016-06-14 MED ORDER — OXYCODONE HCL 15 MG PO TABS: 15.0000 mg | ORAL_TABLET | ORAL | 0 refills | Status: DC | PRN

## 2016-06-14 NOTE — Telephone Encounter (Signed)
I did NOT receive note from Pain Management.  I DID receive Drug Registry.  12/28/2015 she was given # 180 of Oxy 15mg .  Since then she has been given # 150 of Oxy 15mg .  Last 3 Rxes for this dose, this quantity were-- 03/22/2016, 04/20/2016, 05/17/2016.  At this time--will give # 150 WITH NOTE   " DO NOT FILL UNTIL 06/17/2016"  At this time--- Please also go ahead and get note from Pain Management---Verify that reason they are no longer seeing pt is because they are no longer accepting her insurance (which is what pt told me)

## 2016-06-14 NOTE — Telephone Encounter (Signed)
Rx printed as instructed.  Have call into [facility] to get records.  Pt called and left message Rx ready.

## 2016-07-16 ENCOUNTER — Telehealth: Payer: Self-pay | Admitting: Physician Assistant

## 2016-07-16 NOTE — Telephone Encounter (Signed)
Pamela Wall called saying she's due for a refill of Oxycodone on 9.13.17 and needs a refill Rx. Please give her a call regarding this if needed.  Pt's ph# [phone] between 9a-5pm Thank you.

## 2016-07-17 ENCOUNTER — Other Ambulatory Visit: Payer: Self-pay

## 2016-07-17 DIAGNOSIS — G894 Chronic pain syndrome: Secondary | ICD-10-CM

## 2016-07-17 NOTE — Telephone Encounter (Signed)
Last OV 05-17-16 Last refill  8--13-17 Ok to refill?

## 2016-07-18 MED ORDER — OXYCODONE HCL 15 MG PO TABS: 15.0000 mg | ORAL_TABLET | ORAL | 0 refills | Status: DC | PRN

## 2016-07-18 NOTE — Telephone Encounter (Signed)
Approved.  

## 2016-07-18 NOTE — Telephone Encounter (Signed)
RX was picked up by the patient 9/13

## 2016-08-15 ENCOUNTER — Other Ambulatory Visit: Payer: Self-pay | Admitting: Physician Assistant

## 2016-08-15 DIAGNOSIS — G894 Chronic pain syndrome: Secondary | ICD-10-CM

## 2016-08-15 MED ORDER — OXYCODONE HCL 15 MG PO TABS: 15.0000 mg | ORAL_TABLET | ORAL | 0 refills | Status: DC | PRN

## 2016-08-15 MED ORDER — CYCLOBENZAPRINE HCL 10 MG PO TABS: 10.0000 mg | ORAL_TABLET | Freq: Three times a day (TID) | ORAL | 1 refills | Status: DC | PRN

## 2016-08-15 NOTE — Telephone Encounter (Signed)
I discussed with Pamela Wall.  She says she spoke with a pain clinic about this pt this week-- and is in process of getting in with a new pain clinic.  Pt has not seen new pain clinic yet.  But, is in process of Establishing with new pain clinic.  Therefore, will refill med.  Can print Oxy for # 150 + 0.  Can refill Flexeril for # 60 + 1

## 2016-08-15 NOTE — Telephone Encounter (Signed)
Patient called requesting a refill on her  cyclobenzaprine (FLEXERIL) 10 MG And  oxyCODONE (ROXICODONE) 15 MG  CB# [phone]

## 2016-08-15 NOTE — Telephone Encounter (Signed)
Last OV 7-13 Last Refill  Oxycodone9-13.  Flexeril 02-25-14 Ok to refill?

## 2016-08-15 NOTE — Telephone Encounter (Signed)
RX refilled can be picked up 10-12

## 2016-08-20 ENCOUNTER — Encounter: Payer: Self-pay | Admitting: Physician Assistant

## 2016-09-14 ENCOUNTER — Other Ambulatory Visit: Payer: Self-pay | Admitting: Physician Assistant

## 2016-09-14 DIAGNOSIS — G894 Chronic pain syndrome: Secondary | ICD-10-CM

## 2016-09-14 MED ORDER — CYCLOBENZAPRINE HCL 10 MG PO TABS: 10.0000 mg | ORAL_TABLET | Freq: Three times a day (TID) | ORAL | 1 refills | Status: DC | PRN

## 2016-09-14 NOTE — Telephone Encounter (Signed)
Waiting on approval for Oxycodone, Flexeril was sent to [facility] pharmacy

## 2016-09-14 NOTE — Telephone Encounter (Signed)
Patient called requesting a refill on her oxycodone and flexeril.   CB# [phone]

## 2016-09-14 NOTE — Telephone Encounter (Signed)
Last OV 7-13 Last refill 08-15-2016 #150  Refills 0 Okay to refill?

## 2016-09-17 MED ORDER — CYCLOBENZAPRINE HCL 10 MG PO TABS: 10.0000 mg | ORAL_TABLET | Freq: Three times a day (TID) | ORAL | 2 refills | Status: DC | PRN

## 2016-09-17 MED ORDER — OXYCODONE HCL 15 MG PO TABS: 15.0000 mg | ORAL_TABLET | ORAL | 0 refills | Status: DC | PRN

## 2016-09-17 NOTE — Telephone Encounter (Signed)
Rx for flexeril sent to pharmacy.

## 2016-09-17 NOTE — Telephone Encounter (Signed)
Pt came in office today demanding Rx be wriiten. MBD gave a verbal for me to print the Rx

## 2016-09-17 NOTE — Telephone Encounter (Signed)
Patient came to the office on her way to work this morning to pick up her Rx. At that point, I had not had a chance to review her Rx request. But she did not want to leave without her Rx.  Drug Registry was printed and reviewed. She has gotten no other controlled medications and her last Rx was from me on 08/17/2016.  Rx appropriate at this time. Printed, signed, given to patient this morning.

## 2016-09-17 NOTE — Telephone Encounter (Signed)
Flexeril can be sent for #90+2

## 2016-09-17 NOTE — Addendum Note (Signed)
Addended by: Vonna Kotyk A on: 09/17/2016 02:59 PM   Modules accepted: Orders

## 2016-10-15 ENCOUNTER — Other Ambulatory Visit: Payer: Self-pay | Admitting: Physician Assistant

## 2016-10-15 DIAGNOSIS — G894 Chronic pain syndrome: Secondary | ICD-10-CM

## 2016-10-15 NOTE — Telephone Encounter (Signed)
Last OV 7-13 Last refill amitriptyline 7-13 Oxycodone 11-13 Ok to refill?

## 2016-10-15 NOTE — Telephone Encounter (Signed)
Drug registry printed and reviewed. Starting 05/17/16 all prescriptions on her drug registry have been prescribed by me. Fill dates have been 05/17/16, 06/17/16, 07/18/16, 08/17/16, 09/17/16. No additional medications are showing up on the drug registry. No additional prescribers are showing up since 05/17/16.  Oxycodone prescription approved for #150+0.  Amitriptyline is approved for 3 month worth of refills.

## 2016-10-15 NOTE — Telephone Encounter (Signed)
Patient requesting refill on her oxycodone and amitriptyline she uses [facility]  CB# [phone]

## 2016-10-16 MED ORDER — AMITRIPTYLINE HCL 50 MG PO TABS: 50.0000 mg | ORAL_TABLET | Freq: Every day | ORAL | 2 refills | Status: DC

## 2016-10-16 MED ORDER — OXYCODONE HCL 15 MG PO TABS: 15.0000 mg | ORAL_TABLET | ORAL | 0 refills | Status: DC | PRN

## 2016-10-16 NOTE — Telephone Encounter (Signed)
Oxycodone Rx can be picked up after 2pm on 10-17-2016 Amitriptyline sent to [facility]

## 2016-10-17 ENCOUNTER — Ambulatory Visit: Payer: Medicaid Other | Admitting: Physician Assistant

## 2016-10-24 ENCOUNTER — Encounter: Payer: Self-pay | Admitting: Physician Assistant

## 2016-10-24 ENCOUNTER — Ambulatory Visit (INDEPENDENT_AMBULATORY_CARE_PROVIDER_SITE_OTHER): Payer: Medicaid Other | Admitting: Physician Assistant

## 2016-10-24 VITALS — BP 160/110 | HR 68 | Temp 98.2°F | Resp 16 | Wt 115.0 lb

## 2016-10-24 DIAGNOSIS — I10 Essential (primary) hypertension: Secondary | ICD-10-CM

## 2016-10-24 DIAGNOSIS — R232 Flushing: Secondary | ICD-10-CM

## 2016-10-24 DIAGNOSIS — G894 Chronic pain syndrome: Secondary | ICD-10-CM

## 2016-10-24 MED ORDER — OXYCODONE HCL 15 MG PO TABS: 15.0000 mg | ORAL_TABLET | ORAL | 0 refills | Status: DC | PRN

## 2016-10-24 MED ORDER — ESTROGENS CONJUGATED 0.3 MG PO TABS: ORAL_TABLET | ORAL | 1 refills | Status: DC

## 2016-10-24 MED ORDER — AMLODIPINE BESYLATE 5 MG PO TABS: 5.0000 mg | ORAL_TABLET | Freq: Every day | ORAL | 5 refills | Status: DC

## 2016-10-24 NOTE — Progress Notes (Signed)
Patient ID: Pamela Wall MRN: [id number], DOB: 10-21-1981, 35 y.o. Date of Encounter: @DATE @  Chief Complaint:  No chief complaint on file.   HPI: 35 y.o. year old white female  Presents with above.     THE FOLLOWING WAS DOCUMENTED AT HER OV NOTE WITH ME ON---- 04/12/2014:   She says that she was went to [facility] for a laparoscopic cholecystectomy. She was to have the procedure and go home the next day. However apparently once they got in, they found that her gallbladder was behind the liver and was adhered to the liver. They ended up doing an open cholecystectomy and patient says that they punctured her liver. Says that she was in [place] from 02/12/2014 - 02/19/14 and was transferred to [facility] from there. Was at [facility] from 4/17 through 4/24. She is scheduled for her next procedure 04/26/14--this is to place a stent in the bile duct. She also knows that she will have to have at least one other procedure to remove the current stent that she already has placed. As well she thinks that she has an incisional hernia. She and her grandmother report that the incision site looked like it does now as soon as they removed the staples -- this was done at [place] that she is seeing a Dr. Carlis Abbott, Dr. Amalia Hailey, Dr. Penelope Galas at [facility]. Also seeing a doctor [facility]pain management. Says that she had an MRI last week and has an appointment to see Dr. Carlis Abbott 6/22 prior to the stent procedure.  Says that Dr. Greta Doom at the pain clinic is prescribing the cyclobenzaprine and oxycodone.  Says one of the doctors at [facility] has been prescribing the Valium. However she has been unable to get in touch with him to get refills. Says the 10 mg is too strong and " knocks her out". Says a half of a pill (5 mg) works perfectly. As the prior to all of this she never had any problems with anxiety. Also never had problems with insomnia. Currently he is prescribed amitriptyline for the insomnia. This is  being prescribed by Dr. Greta Doom the pain clinic. Says prior to the amitriptyline she wasn't getting any sleep. With the amitriptyline, she is able to get 4 hours of sleep but that is all.  She says that prior to this she was very active and functional.  She has 2 children ages 48 and age  47.  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 She and her kids are staying with her grandmother who is with her at the visit today.   THE FOLLOWING IS FROM HER OV NOTE WITH ME ----05/20/2014:   Today she she reports that when she went for followup at [facility] that they were able to remove the stent and did not require any further surgeries. Also says that they felt the protrusion at the right upper quadrant may be secondary to layer of adipose tissue which was not pressed down firmly prior to the sutures being placed-- or  felt there could possibly be a small area of hernia -- plan to just monitor this for now. Patient states that they went down her throat to remove the stent. Says that she has had no further abdominal surgeries since her last visit with me.  However, for some reason, she developed swelling of both of her legs from July 13 -  July 15. She says that both her ankles and  knees appeared swollen as big as her calves. However last night she elevated her legs and applied ice and this morning the swelling has resolved.  Also, she says that she is trying to wean down the diazepam but is wondering is she can get a refill. Says that she does still have times of feeling as if she is "shaking on the inside "and also having difficulty sleeping.   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  She had a hysterectomy secondary to cancer at age 33 months old. She says that she also had radiation therapy. It was later documented that the radiation had "killed her ovaries". Says that she was on hormone  replacement therapy to go through puberty. Later after that the hormone replacement therapy was used off and on. Says that when she was in her 33s she experienced hot flashes so they treated this with Premarin and Estratest. Says that they wanted to limit the amount of hormones that she was on so was eventually discontinued. Says that she went for years without any menopausal symptoms. However she says that she is now having significant sweats at night--even soaking her bed sheets--says that these are the exact same symptoms that she had had in the past. She recently called here about referral to GYN which I had approved. She is wanting to go ahead and get on some hormones to use while she waits for that appointment.  She also reports that her blood pressure has been a little bit high when she has gone to pain management recently. He says that back before this recent surgery her blood pressure used to be low in the 100s over 70s. Says that now it is about 145/100 every time she goes to pain management.  She is also requesting refill on Valium but wants to decrease the dose and just wants to have some available if needed.   ---05/17/2016: She has multiple issues to address at today's visit: --- Needs referral to a new GI doctor: Says that she is now in the midst of legal litigation and needs a new GI doctor to help manage her current symptoms. Says that she is having subclinical episodes of it being as if her GI system gets totally stopped it and then suddenly goes. Is that during that time where it feels like everything stops she projectile vomits. An once things start to move again she has diarrhea. Says that these cycles were occurring every couple of months then every month and then every 2 weeks and are now getting more and more frequent. Says that she stopped gluten for a period of time hoping that would help. Kept a food diary. Nohing has helped and she continues with this cycle. Also needs  a refill on her Phenergan to use.  ----Needs referral to a new pain management.: Says that she was going to [facility] and most recently had been seeing Dr. Greta Doom. Says that her last office visit there she was informed that they no longer take Medicaid and can no longer prescribe her any further medications and provide any further care. Says that she is very upset because she has been seeing that same pain clinic for about 10 years and they know her very well. At 76 months of age she was found to have a very rare type of cancer which she says was managed at [facility] that she had hysterectomy chemotherapy and radiation  therapy at that time. Because of the growth that occurred in her body after this treatment and the effects of this treatment, her body is not level and symmetrical and therefore has chronic pain in her back and abdomen.   She states that she still has one refill remaining on her Elavil and one refill remaining on her Flexeril. Does need prescription for oxycodone. She has her last bottle with her which reads take 1 pill 5 times a day and was for #150 and was dispensed on 04/20/16.  Today she tells me that she has been raising her sister's children. She has pictures of them on her phone that she shows me. She also tells me that she has gotten her "dream job and loves it " Is working at [employer]. However says that she needs to make sure that she keeps that job and does not lose that job because of these GI symptoms and also that it is embarrassing that she is having these GI symptoms. Says at the end of September she will be eligible to get on her company's insurance and may not have to have Medicaid after that point. However says that DSS informed her that because she has 3 dependents she may still requalify for Medicaid-- she is not sure. She is in very good spirits today.   10/24/2016: She states that recently for a period  of time she was without insurance. During that time her blood pressure medicine was not continued. Notes the blood pressure is reading high today off of medicines that she needs to get back on blood pressure medicine which was amlodipine 5 mg daily. She also states that she is having horrible hot flashes. Says that she was seeing the same GYN for 16 years and has called them to get appointment but it's going to take her 4 months before she can be seen there. Goes to [facility] and sees Elon Alas. Says the GYN will not refill her hormone therapy until she has office visit. Questing that I give her refills until she can follow-up with GYN. She has already picked up her pain medicines December 12. Is not out of these. No other concerns to address today.    Past Medical History:  Diagnosis Date  . Asthma    childhood - no prob as adult  . Back pain   . 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 Depression    history - no meds  . Endodermal sinus tumor   . Enterocolitis   . Osteopenia   . Scoliosis   . 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 amitriptyline (ELAVIL) 50 MG tablet Take 1 tablet (50 mg total) by mouth at bedtime. Reported on 05/17/2016 30 tablet 2  . cyclobenzaprine (FLEXERIL) 10 MG tablet Take 1 tablet (10 mg total) by mouth 3 (three) times daily as needed. (Patient taking differently: Take 10 mg by mouth 3 (three) times daily as needed. ) 90 tablet 2  . promethazine (PHENERGAN) 25 MG suppository Place 1 suppository (25 mg total) rectally every 6 (six) hours as needed for nausea or vomiting. 12 each 0  . promethazine (PHENERGAN) 25 MG tablet TAKE 1 TABLET BY MOUTH EVERY 8 HOURS AS NEEDED FOR NAUSEA AND VOMITING. 30 tablet 2  . estrogens, conjugated, (PREMARIN) 0.3 MG tablet TAKE (1) TABLET  BY MOUTH ONCE DAILY. 30 tablet 1  . oxyCODONE (ROXICODONE)  15 MG immediate release tablet Take 1 tablet (15 mg total) by mouth every 4 (four) hours as needed. Must last 30 days. 150 tablet 0  . diazepam (VALIUM) 2 MG tablet Take 1 tablet (2 mg total) by mouth every 6 (six) hours as needed for anxiety (refill must last 30 days). (Patient not taking: Reported on 10/24/2016) 30 tablet 2  . amLODipine (NORVASC) 5 MG tablet TAKE 1 TABLET ONCE DAILY. (Patient not taking: Reported on 10/24/2016) 30 tablet 0   No facility-administered medications prior to visit.     Allergies:  Allergies  Allergen Reactions  . Dog Epithelium Itching    Also Cat dander and Horse dander. Patient takes zyrtec as prophylactic.     Social History   Social History  . Marital status: Single    Spouse name: N/A  . Number of children: N/A  . Years of education: N/A   Occupational History  . Not on file.   Social History Main Topics  . Smoking status: Current Every Day Smoker    Packs/day: 0.25    Years: 10.00    Types: E-cigarettes  . Smokeless tobacco: Never Used     Comment: vapor cigs  . Alcohol use Yes     Comment: socially  . Drug use: No  . Sexual activity: Yes    Birth control/ protection: Surgical   Other Topics Concern  . Not on file   Social History Narrative  . No narrative on file    Family History  Problem Relation Age of Onset  . Diabetes Father   . Breast cancer Paternal Grandmother   . Diabetes Paternal Grandmother      Review of Systems:  See HPI for pertinent ROS. All other ROS negative.    Physical Exam: Blood pressure (!) 160/110, pulse 68, temperature 98.2 F (36.8 C), temperature source Oral, resp. rate 16, weight 115 lb (52.2 kg), last menstrual period 08/15/1984, SpO2 98 %., Body mass index is 21.73 kg/m. General: WNWD WF. Appears in no acute distress. Neck: Supple. No thyromegaly. No lymphadenopathy. Lungs: Clear bilaterally to auscultation without wheezes, rales, or rhonchi. Breathing is unlabored. Heart: RRR with S1 S2.  No murmurs, rubs, or gallops. Musculoskeletal:  Strength and tone normal for age. Extremities/Skin: Warm and dry.  She has scars on her right low back and scars on her abdomen.  Neuro: Alert and oriented X 3. Moves all extremities spontaneously. Gait is normal. CNII-XII grossly in tact. Psych:  Responds to questions appropriately with a normal affect.     ASSESSMENT AND PLAN:  35 y.o. year old female with   1. Hot flashes Will refill this to hold her over until she can see her GYN. - estrogens, conjugated, (PREMARIN) 0.3 MG tablet; TAKE (1) TABLET BY MOUTH ONCE DAILY.  Dispense: 30 tablet; Refill: 1  2. Essential hypertension Blood Pressure elevated off of medicine. Resume prior medicine-- amlodipine 5 mg daily. - amLODipine (NORVASC) 5 MG tablet; Take 1 tablet (5 mg total) by mouth daily.  Dispense: 30 tablet; Refill: 5  3. Chronic pain syndrome I reviewed that her last prescription for oxycodone and was 10/16/16. Today I went ahead and printed another prescription that says do not fill until 11/16/2016. As well, she has been having issues regarding getting off work getting here to pick up prescription and getting prescription filled in timely manner --in which she is not getting medicines too early but that  she is not running out of medicines. I have told her that she can start calling me about a week in advance and we can print prescription with note stating "do not fill until----" - oxyCODONE (ROXICODONE) 15 MG immediate release tablet; Take 1 tablet (15 mg total) by mouth every 4 (four) hours as needed. Must last 30 days.  Dispense: 150 tablet; Refill: 0  In addition to this medication the pain clinic was also prescribing her Elavil and Flexeril.  I will cont to refill these.   ADDENDUM ADDED 06/25/2016: Received records from Pain Management Dr. Franco Collet OV note dated 03/22/2016. It is a normal note.  Makes no mention of patient misusing medications.  At that [facility] she was given  Rx for oxycodone 15mg  -- take 1 tablet by mouth 5 times a day as needed for pain   # 150 + 0.  That note states "patient is instructed to follow-up in one month for pain reevaluation or sooner as needed"  Note dated 02/23/16 includes the following information --- "urine drug screen obtained at the last visit was consistent. No aberrent behavior noted"  At prior OV I placed a referral to pain clinic. However in follow-up of fact I spoke with Maudie Mercury and she said that a lot of pain clinics were denying her especially since there was mention of litigation in her notes. I will continue to prescribe her pain meds for now.  Routine office visit 3 months. Call when needs next prescription printed.--And future date with "do not fill until--"  Signed, [address] [place], [place], [facility] 10/24/2016 9:04 AM

## 2016-12-12 ENCOUNTER — Other Ambulatory Visit: Payer: Self-pay

## 2016-12-12 DIAGNOSIS — G894 Chronic pain syndrome: Secondary | ICD-10-CM

## 2016-12-12 MED ORDER — OXYCODONE HCL 15 MG PO TABS: 15.0000 mg | ORAL_TABLET | ORAL | 0 refills | Status: DC | PRN

## 2016-12-12 NOTE — Telephone Encounter (Signed)
Approved. Okay to make note on prescription that states do not fill until 12/14/2016.  I have reviewed printed drug registry. This is appropriate. No other providers are prescribing narcotic medications. Only one narcotic medication is being prescribed. Appropriate monthly fill dates

## 2016-12-12 NOTE — Telephone Encounter (Signed)
Last Ov 12-20 Last refill 10-24-2016  pt is asking to ab able to fill on Fri 2-9 b/c pharmacy is closed on Sun 2-11 Okay to refill?

## 2016-12-12 NOTE — Telephone Encounter (Signed)
rx filled pt can pick up after 2pm 2-8

## 2016-12-13 NOTE — Telephone Encounter (Signed)
lvm for pt that Rx could be picked up

## 2016-12-24 ENCOUNTER — Other Ambulatory Visit: Payer: Self-pay | Admitting: Physician Assistant

## 2016-12-24 DIAGNOSIS — R232 Flushing: Secondary | ICD-10-CM

## 2016-12-24 NOTE — Telephone Encounter (Signed)
Rx filled per protocol  

## 2017-01-09 ENCOUNTER — Other Ambulatory Visit: Payer: Self-pay

## 2017-01-09 DIAGNOSIS — G894 Chronic pain syndrome: Secondary | ICD-10-CM

## 2017-01-09 MED ORDER — OXYCODONE HCL 15 MG PO TABS: 15.0000 mg | ORAL_TABLET | ORAL | 0 refills | Status: DC | PRN

## 2017-01-09 NOTE — Telephone Encounter (Signed)
Approved. # 150 + 0 Add note: "Do Not fill until 01/12/2017"  Reviewed Drug Registry.  This is the only medication on drug regsitry. I am the only prescriber on her drug registry, dated back to 07/18/2016

## 2017-01-09 NOTE — Telephone Encounter (Signed)
Last OV 12-20 LAST REFILL 12-12-2016 Ok to refill?

## 2017-01-09 NOTE — Telephone Encounter (Signed)
Rx filled patient can pick up after 2pm on 01-11-2016. LVM for patient indicating this

## 2017-01-24 ENCOUNTER — Ambulatory Visit: Payer: Medicaid Other | Admitting: Physician Assistant

## 2017-01-28 ENCOUNTER — Ambulatory Visit (INDEPENDENT_AMBULATORY_CARE_PROVIDER_SITE_OTHER): Payer: Commercial Managed Care - HMO | Admitting: Physician Assistant

## 2017-01-28 ENCOUNTER — Encounter: Payer: Self-pay | Admitting: Physician Assistant

## 2017-01-28 ENCOUNTER — Other Ambulatory Visit: Payer: Self-pay | Admitting: Physician Assistant

## 2017-01-28 VITALS — BP 120/78 | HR 113 | Temp 98.3°F | Resp 18 | Wt 121.8 lb

## 2017-01-28 DIAGNOSIS — I10 Essential (primary) hypertension: Secondary | ICD-10-CM

## 2017-01-28 DIAGNOSIS — R232 Flushing: Secondary | ICD-10-CM

## 2017-01-28 DIAGNOSIS — R319 Hematuria, unspecified: Secondary | ICD-10-CM

## 2017-01-28 DIAGNOSIS — G894 Chronic pain syndrome: Secondary | ICD-10-CM | POA: Diagnosis not present

## 2017-01-28 DIAGNOSIS — R300 Dysuria: Secondary | ICD-10-CM | POA: Diagnosis not present

## 2017-01-28 DIAGNOSIS — N390 Urinary tract infection, site not specified: Secondary | ICD-10-CM

## 2017-01-28 LAB — URINALYSIS, ROUTINE W REFLEX MICROSCOPIC
Bilirubin Urine: NEGATIVE
GLUCOSE, UA: NEGATIVE
Ketones, ur: NEGATIVE
Nitrite: NEGATIVE
Specific Gravity, Urine: 1.025 (ref 1.001–1.035)
pH: 5.5 (ref 5.0–8.0)

## 2017-01-28 LAB — URINALYSIS, MICROSCOPIC ONLY
Casts: NONE SEEN [LPF]
Crystals: NONE SEEN [HPF]
YEAST: NONE SEEN [HPF]

## 2017-01-28 MED ORDER — ESTRADIOL 0.5 MG PO TABS: 0.5000 mg | ORAL_TABLET | Freq: Every day | ORAL | 11 refills | Status: DC

## 2017-01-28 MED ORDER — NITROFURANTOIN MONOHYD MACRO 100 MG PO CAPS: 100.0000 mg | ORAL_CAPSULE | Freq: Two times a day (BID) | ORAL | 0 refills | Status: DC

## 2017-01-28 NOTE — Progress Notes (Signed)
Patient ID: Pamela Wall MRN: [id number], DOB: February 24, 1981, 36 y.o. Date of Encounter: @DATE @  Chief Complaint:  Chief Complaint  Patient presents with  . Abdominal Pain  . Hematuria  . burning with urine    HPI: 36 y.o. year old white female  Presents with above.     THE FOLLOWING WAS DOCUMENTED AT HER OV NOTE WITH ME ON---- 04/12/2014:   She says that she was went to [facility] for a laparoscopic cholecystectomy. She was to have the procedure and go home the next day. However apparently once they got in, they found that her gallbladder was behind the liver and was adhered to the liver. They ended up doing an open cholecystectomy and patient says that they punctured her liver. Says that she was in [place] from 02/12/2014 - 02/19/14 and was transferred to [facility] from there. Was at [facility] from 4/17 through 4/24. She is scheduled for her next procedure 04/26/14--this is to place a stent in the bile duct. She also knows that she will have to have at least one other procedure to remove the current stent that she already has placed. As well she thinks that she has an incisional hernia. She and her grandmother report that the incision site looked like it does now as soon as they removed the staples -- this was done at [place] that she is seeing a Dr. Carlis Abbott, Dr. Amalia Hailey, Dr. Penelope Galas at [facility]. Also seeing a doctor [place]--pain management. Says that she had an MRI last week and has an appointment to see Dr. Carlis Abbott 6/22 prior to the stent procedure.  Says that Dr. Greta Doom at the pain clinic is prescribing the cyclobenzaprine and oxycodone.  Says one of the doctors at [facility] has been prescribing the Valium. However she has been unable to get in touch with him to get refills. Says the 10 mg is too strong and " knocks her out". Says a half of a pill (5 mg) works perfectly. As the prior to all of this she never had any problems with anxiety. Also never had problems with  insomnia. Currently he is prescribed amitriptyline for the insomnia. This is being prescribed by Dr. Greta Doom the pain clinic. Says prior to the amitriptyline she wasn't getting any sleep. With the amitriptyline, she is able to get 4 hours of sleep but that is all.  She says that prior to this she was very active and functional.  She has 2 children ages 60 and age  106.  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 She and her kids are staying with her grandmother who is with her at the visit today.   THE FOLLOWING IS FROM HER OV NOTE WITH ME ----05/20/2014:   Today she she reports that when she went for followup at [facility] that they were able to remove the stent and did not require any further surgeries. Also says that they felt the protrusion at the right upper quadrant may be secondary to layer of adipose tissue which was not pressed down firmly prior to the sutures being placed-- or  felt there could possibly be a small area of hernia -- plan to just monitor this for now. Patient states that they went down her throat to remove the stent. Says that she has had no further abdominal surgeries since her last visit with me.  However, for some reason, she developed swelling of both of her legs  from July 13 -  July 15. She says that both her ankles and knees appeared swollen as big as her calves. However last night she elevated her legs and applied ice and this morning the swelling has resolved.  Also, she says that she is trying to wean down the diazepam but is wondering is she can get a refill. Says that she does still have times of feeling as if she is "shaking on the inside "and also having difficulty sleeping.   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  She had a hysterectomy secondary to cancer at age 34 months old. She says that she also had radiation therapy. It was later  documented that the radiation had "killed her ovaries". Says that she was on hormone replacement therapy to go through puberty. Later after that the hormone replacement therapy was used off and on. Says that when she was in her 41s she experienced hot flashes so they treated this with Premarin and Estratest. Says that they wanted to limit the amount of hormones that she was on so was eventually discontinued. Says that she went for years without any menopausal symptoms. However she says that she is now having significant sweats at night--even soaking her bed sheets--says that these are the exact same symptoms that she had had in the past. She recently called here about referral to GYN which I had approved. She is wanting to go ahead and get on some hormones to use while she waits for that appointment.  She also reports that her blood pressure has been a little bit high when she has gone to pain management recently. He says that back before this recent surgery her blood pressure used to be low in the 100s over 70s. Says that now it is about 145/100 every time she goes to pain management.  She is also requesting refill on Valium but wants to decrease the dose and just wants to have some available if needed.   ---05/17/2016: She has multiple issues to address at today's visit: --- Needs referral to a new GI doctor: Says that she is now in the midst of legal litigation and needs a new GI doctor to help manage her current symptoms. Says that she is having subclinical episodes of it being as if her GI system gets totally stopped it and then suddenly goes. Is that during that time where it feels like everything stops she projectile vomits. An once things start to move again she has diarrhea. Says that these cycles were occurring every couple of months then every month and then every 2 weeks and are now getting more and more frequent. Says that she stopped gluten for a period of time hoping that would  help. Kept a food diary. Nohing has helped and she continues with this cycle. Also needs a refill on her Phenergan to use.  ----Needs referral to a new pain management.: Says that she was going to [facility] and most recently had been seeing Dr. Greta Doom. Says that her last office visit there she was informed that they no longer take Medicaid and can no longer prescribe her any further medications and provide any further care. Says that she is very upset because she has been seeing that same pain clinic for about 10 years and they know her very well. At 29 months of age she was found to have a very rare type of cancer which she  says was managed at [place] that she had hysterectomy chemotherapy and radiation therapy at that time. Because of the growth that occurred in her body after this treatment and the effects of this treatment, her body is not level and symmetrical and therefore has chronic pain in her back and abdomen.   She states that she still has one refill remaining on her Elavil and one refill remaining on her Flexeril. Does need prescription for oxycodone. She has her last bottle with her which reads take 1 pill 5 times a day and was for #150 and was dispensed on 04/20/16.  Today she tells me that she has been raising her sister's children. She has pictures of them on her phone that she shows me. She also tells me that she has gotten her "dream job and loves it " Is working at [employer]. However says that she needs to make sure that she keeps that job and does not lose that job because of these GI symptoms and also that it is embarrassing that she is having these GI symptoms. Says at the end of September she will be eligible to get on her company's insurance and may not have to have Medicaid after that point. However says that DSS informed her that because she has 3 dependents she may still requalify for Medicaid-- she is not  sure. She is in very good spirits today.   10/24/2016: She states that recently for a period of time she was without insurance. During that time her blood pressure medicine was not continued. Notes the blood pressure is reading high today off of medicines that she needs to get back on blood pressure medicine which was amlodipine 5 mg daily. She also states that she is having horrible hot flashes. Says that she was seeing the same GYN for 16 years and has called them to get appointment but it's going to take her 4 months before she can be seen there. Goes to [facility] and sees Elon Alas. Says the GYN will not refill her hormone therapy until she has office visit. Questing that I give her refills until she can follow-up with GYN. She has already picked up her pain medicines December 12. Is not out of these. No other concerns to address today.   01/28/2017: She reports that she has recently been having burning when she urinates, urinating very frequently and has also just started noticing that her urine looks bloody. She has had no fevers or chills and no back pain. She also reports that with her new insurance plan that the Premarin is a very high tier and high cost, but the Estrace is the low tier/ low cost preferred medicine. Requesting that I change this. Says that her co-pay to see GYN a $75 so she is waiting to get tax money back before she can schedule that visit. Says that she has been on this medication since she was 36 years old. She states that she already has prescription for pain medicine and is not due to get new prescription on that yet.  She states that she did resume her BP med. Is taking this daily. No adverse effects. No other concerns to address today.  Past Medical History:  Diagnosis Date  . Asthma    childhood - no prob as adult  . Back pain   . 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 Depression    history - no meds  .  Endodermal sinus tumor   . Enterocolitis    . Osteopenia   . Scoliosis   . 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 amitriptyline (ELAVIL) 50 MG tablet Take 1 tablet (50 mg total) by mouth at bedtime. Reported on 05/17/2016 30 tablet 2  . amLODipine (NORVASC) 5 MG tablet Take 1 tablet (5 mg total) by mouth daily. 30 tablet 5  . cyclobenzaprine (FLEXERIL) 10 MG tablet Take 1 tablet (10 mg total) by mouth 3 (three) times daily as needed. (Patient taking differently: Take 10 mg by mouth 3 (three) times daily as needed. ) 90 tablet 2  . oxyCODONE (ROXICODONE) 15 MG immediate release tablet Take 1 tablet (15 mg total) by mouth every 4 (four) hours as needed. Must last 30 days. 150 tablet 0  . PREMARIN 0.3 MG tablet TAKE 1 TABLET ONCE DAILY. 30 tablet 0  . promethazine (PHENERGAN) 25 MG suppository Place 1 suppository (25 mg total) rectally every 6 (six) hours as needed for nausea or vomiting. 12 each 0  . promethazine (PHENERGAN) 25 MG tablet TAKE 1 TABLET BY MOUTH EVERY 8 HOURS AS NEEDED FOR NAUSEA AND VOMITING. 30 tablet 2  . diazepam (VALIUM) 2 MG tablet Take 1 tablet (2 mg total) by mouth every 6 (six) hours as needed for anxiety (refill must last 30 days). (Patient not taking: Reported on 10/24/2016) 30 tablet 2   No facility-administered medications prior to visit.     Allergies:  Allergies  Allergen Reactions  . Dog Epithelium Itching    Also Cat dander and Horse dander. Patient takes zyrtec as prophylactic.     Social History   Social History  . Marital status: Single    Spouse name: N/A  . Number of children: N/A  . Years of education: N/A   Occupational History  . Not on file.   Social History Main Topics  . Smoking status: Current Every Day Smoker    Packs/day: 0.25    Years: 10.00    Types: E-cigarettes  . Smokeless tobacco: Never Used     Comment: vapor cigs  . Alcohol use Yes      Comment: socially  . Drug use: No  . Sexual activity: Yes    Birth control/ protection: Surgical   Other Topics Concern  . Not on file   Social History Narrative  . No narrative on file    Family History  Problem Relation Age of Onset  . Diabetes Father   . Breast cancer Paternal Grandmother   . Diabetes Paternal Grandmother      Review of Systems:  See HPI for pertinent ROS. All other ROS negative.    Physical Exam: Blood pressure 120/78, pulse (!) 113, temperature 98.3 F (36.8 C), temperature source Oral, resp. rate 18, weight 121 lb 12.8 oz (55.2 kg), last menstrual period 08/15/1984, SpO2 98 %., Body mass index is 23.01 kg/m. General: WNWD WF. Appears in no acute distress. Neck: Supple. No thyromegaly. No lymphadenopathy. Lungs: Clear bilaterally to auscultation without wheezes, rales, or rhonchi. Breathing is unlabored. Heart: RRR with S1 S2. No murmurs, rubs, or gallops. Musculoskeletal:  Strength and tone normal for age. Extremities/Skin: Warm and dry.  She has scars on her right low back and scars on her abdomen.  Neuro: Alert and oriented X 3. Moves all extremities spontaneously. Gait is normal. CNII-XII grossly in tact.  Psych:  Responds to questions appropriately with a normal affect.     ASSESSMENT AND PLAN:  36 y.o. year old female with   Burning with urination - Urinalysis, Routine w reflex microscopic  Urinary tract infection with hematuria, site unspecified 01/28/2017: She is to start the Macrobid immediately and take as directed. I will follow up urine culture results as well. - nitrofurantoin, macrocrystal-monohydrate, (MACROBID) 100 MG capsule; Take 1 capsule (100 mg total) by mouth 2 (two) times daily.  Dispense: 10 capsule; Refill: 0 - Urine culture  Chronic pain syndrome 01/28/2017: Since she is leaving a urine sample today, I will run this for a urine drug screen to have this documentation. She already has her pain meds and is not due for  prescription yet she will call and needs next Rx. - Drug Scr Ur, Pain Mgmt, Reflex Conf ---In the past--- she was having issues regarding getting off work, getting here to pick up prescription, and getting prescription filled in timely manner ---At that time I told her that she can start calling me about a     week in advance and we can print prescription with note stating "do not fill until----" ---In addition to this medication the pain clinic was also prescribing her Elavil and Flexeril.      I will cont to refill these.   ADDENDUM ADDED 06/25/2016: Received records from Pain Management Dr. Franco Collet OV note dated 03/22/2016. It is a normal note.  Makes no mention of patient misusing medications.  At that [place] she was given Rx for oxycodone 15mg  -- take 1 tablet by mouth 5 times a day as needed for pain   # 150 + 0.  That note states "patient is instructed to follow-up in one month for pain reevaluation or sooner as needed"  Note dated 02/23/16 includes the following information --- "urine drug screen obtained at the last visit was consistent. No aberrent behavior noted"  At prior OV I placed a referral to pain clinic. However in follow-up of fact I spoke with Maudie Mercury and she said that a lot of pain clinics were denying her especially since there was mention of litigation in her notes. I will continue to prescribe her pain meds for now.   Essential hypertension 01/28/2017: Blood Pressure is at goal/controlled. Continue current medication.  Hot flashes 01/28/2017: Will change her HRT to Estrace. She will f/u with Gyn when she has the finances to do so - estradiol (ESTRACE) 0.5 MG tablet; Take 1 tablet (0.5 mg total) by mouth daily.  Dispense: 30 tablet; Refill: 11   Routine office visit 3 months. Call when needs next prescription printed.--And future date with "do not fill until--"  Signed, Karis Juba, [place], [facility] 01/28/2017 11:26 AM

## 2017-01-29 ENCOUNTER — Other Ambulatory Visit: Payer: Self-pay | Admitting: Physician Assistant

## 2017-01-29 LAB — CREATININE, URINE, RANDOM: CREATININE, URINE: 179 mg/dL (ref 20–320)

## 2017-01-29 NOTE — Telephone Encounter (Signed)
Refill appropriate 

## 2017-02-06 LAB — DRUG ABUSE PANEL 10-50, U
AMPHETAMINES (1000 ng/mL SCRN): NEGATIVE
BARBITURATES: NEGATIVE
BENZODIAZEPINES: NEGATIVE
COCAINE METABOLITES: NEGATIVE
MARIJUANA MET (50 ng/mL SCRN): NEGATIVE
METHADONE: NEGATIVE
METHAQUALONE: NEGATIVE
OPIATES: NEGATIVE
PHENCYCLIDINE: NEGATIVE
PROPOXYPHENE: NEGATIVE

## 2017-02-11 ENCOUNTER — Other Ambulatory Visit: Payer: Self-pay | Admitting: Physician Assistant

## 2017-02-11 DIAGNOSIS — G894 Chronic pain syndrome: Secondary | ICD-10-CM

## 2017-02-11 MED ORDER — OXYCODONE HCL 15 MG PO TABS: 15.0000 mg | ORAL_TABLET | ORAL | 0 refills | Status: DC | PRN

## 2017-02-11 NOTE — Telephone Encounter (Signed)
Drug registry reviewed. Last Rx 01/12/17. All prescriptions on the drug registry are prescribed by me.Getting no Rx controlled medication from any other providors.  Approved. #[id number].

## 2017-02-11 NOTE — Telephone Encounter (Signed)
Rx can be picked up today not able to lve message for patient no VM comes on

## 2017-02-11 NOTE — Telephone Encounter (Signed)
Pt called needing oxycodone, she runs out today

## 2017-02-11 NOTE — Telephone Encounter (Signed)
Last OV 3-26 Last refill 3-10 Ok to refill?

## 2017-02-26 ENCOUNTER — Other Ambulatory Visit: Payer: Self-pay | Admitting: Physician Assistant

## 2017-02-26 NOTE — Telephone Encounter (Signed)
Ok to refill 

## 2017-02-26 NOTE — Telephone Encounter (Signed)
Approved 6/5

## 2017-03-02 ENCOUNTER — Other Ambulatory Visit: Payer: Self-pay | Admitting: Physician Assistant

## 2017-03-04 NOTE — Telephone Encounter (Signed)
Refill appropriate 

## 2017-03-12 ENCOUNTER — Other Ambulatory Visit: Payer: Self-pay

## 2017-03-12 DIAGNOSIS — G894 Chronic pain syndrome: Secondary | ICD-10-CM

## 2017-03-12 NOTE — Telephone Encounter (Signed)
Last OV 3/26 Last refill 4/10 patient is requesting 3 rx's be printed at a time  Drug registry printed Ok to refill?

## 2017-03-13 MED ORDER — OXYCODONE HCL 15 MG PO TABS: 15.0000 mg | ORAL_TABLET | ORAL | 0 refills | Status: DC | PRN

## 2017-03-13 NOTE — Telephone Encounter (Signed)
Rx can be picked up after 2pm today LVM for patient

## 2017-03-13 NOTE — Telephone Encounter (Signed)
Drug Registry reviewed. This is appropriate. She has gotten prescriptions filled monthly with no early refills and I am the only prescriber and this oxycodone is the only controlled medicine on the registry. May print set of 3 prescriptions. The first to be filled 03/14/17,then 04/14/17, and the 3rd for 05/14/17

## 2017-04-04 ENCOUNTER — Other Ambulatory Visit: Payer: Self-pay

## 2017-04-04 MED ORDER — AMITRIPTYLINE HCL 50 MG PO TABS: 50.0000 mg | ORAL_TABLET | Freq: Every day | ORAL | 5 refills | Status: DC

## 2017-04-04 NOTE — Telephone Encounter (Signed)
rx filled

## 2017-04-04 NOTE — Telephone Encounter (Signed)
Last OV 3/26 Last refill 4/30 Okay to refill?

## 2017-04-04 NOTE — Telephone Encounter (Signed)
Approved.  #[id number]. 

## 2017-05-03 ENCOUNTER — Other Ambulatory Visit: Payer: Self-pay | Admitting: Physician Assistant

## 2017-06-13 ENCOUNTER — Ambulatory Visit (INDEPENDENT_AMBULATORY_CARE_PROVIDER_SITE_OTHER): Payer: 59 | Admitting: Physician Assistant

## 2017-06-13 ENCOUNTER — Encounter: Payer: Self-pay | Admitting: Physician Assistant

## 2017-06-13 VITALS — BP 130/88 | HR 96 | Temp 97.9°F | Resp 18 | Wt 118.4 lb

## 2017-06-13 DIAGNOSIS — G894 Chronic pain syndrome: Secondary | ICD-10-CM

## 2017-06-13 DIAGNOSIS — I10 Essential (primary) hypertension: Secondary | ICD-10-CM

## 2017-06-13 DIAGNOSIS — R232 Flushing: Secondary | ICD-10-CM

## 2017-06-13 MED ORDER — OXYCODONE HCL 15 MG PO TABS: 15.0000 mg | ORAL_TABLET | ORAL | 0 refills | Status: DC | PRN

## 2017-06-13 NOTE — Progress Notes (Addendum)
Patient ID: Pamela Wall MRN: [id number], DOB: Jun 10, 1981, 36 y.o. Date of Encounter: @DATE @  Chief Complaint:  Chief Complaint  Patient presents with  . routine follow up    HPI: 36 y.o. year old white female  Presents with above.     THE FOLLOWING WAS DOCUMENTED AT HER OV NOTE WITH ME ON---- 04/12/2014:   She says that she was went to [facility] for a laparoscopic cholecystectomy. She was to have the procedure and go home the next day. However apparently once they got in, they found that her gallbladder was behind the liver and was adhered to the liver. They ended up doing an open cholecystectomy and patient says that they punctured her liver. Says that she was in [place] from 02/12/2014 - 02/19/14 and was transferred to [facility] from there. Was at [facility] from 4/17 through 4/24. She is scheduled for her next procedure 04/26/14--this is to place a stent in the bile duct. She also knows that she will have to have at least one other procedure to remove the current stent that she already has placed. As well she thinks that she has an incisional hernia. She and her grandmother report that the incision site looked like it does now as soon as they removed the staples -- this was done at [place] that she is seeing a Dr. Carlis Abbott, Dr. Amalia Hailey, Dr. Penelope Galas at [facility]. Also seeing a doctor [place]--pain management. Says that she had an MRI last week and has an appointment to see Dr. Carlis Abbott 6/22 prior to the stent procedure.  Says that Dr. Greta Doom at the pain clinic is prescribing the cyclobenzaprine and oxycodone.  Says one of the doctors at [facility] has been prescribing the Valium. However she has been unable to get in touch with him to get refills. Says the 10 mg is too strong and " knocks her out". Says a half of a pill (5 mg) works perfectly. As the prior to all of this she never had any problems with anxiety. Also never had problems with insomnia. Currently he is prescribed  amitriptyline for the insomnia. This is being prescribed by Dr. Greta Doom the pain clinic. Says prior to the amitriptyline she wasn't getting any sleep. With the amitriptyline, she is able to get 4 hours of sleep but that is all.  She says that prior to this she was very active and functional.  She has 2 children ages 72 and age  75.  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 She and her kids are staying with her grandmother who is with her at the visit today.   THE FOLLOWING IS FROM HER OV NOTE WITH ME ----05/20/2014:   Today she she reports that when she went for followup at [facility] that they were able to remove the stent and did not require any further surgeries. Also says that they felt the protrusion at the right upper quadrant may be secondary to layer of adipose tissue which was not pressed down firmly prior to the sutures being placed-- or  felt there could possibly be a small area of hernia -- plan to just monitor this for now. Patient states that they went down her throat to remove the stent. Says that she has had no further abdominal surgeries since her last visit with me.  However, for some reason, she developed swelling of both of her legs from July 13 -  July 15.  She says that both her ankles and knees appeared swollen as big as her calves. However last night she elevated her legs and applied ice and this morning the swelling has resolved.  Also, she says that she is trying to wean down the diazepam but is wondering is she can get a refill. Says that she does still have times of feeling as if she is "shaking on the inside "and also having difficulty sleeping.   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  She had a hysterectomy secondary to cancer at age 34 months old. She says that she also had radiation therapy. It was later documented that the radiation had "killed her  ovaries". Says that she was on hormone replacement therapy to go through puberty. Later after that the hormone replacement therapy was used off and on. Says that when she was in her 51s she experienced hot flashes so they treated this with Premarin and Estratest. Says that they wanted to limit the amount of hormones that she was on so was eventually discontinued. Says that she went for years without any menopausal symptoms. However she says that she is now having significant sweats at night--even soaking her bed sheets--says that these are the exact same symptoms that she had had in the past. She recently called here about referral to GYN which I had approved. She is wanting to go ahead and get on some hormones to use while she waits for that appointment.  She also reports that her blood pressure has been a little bit high when she has gone to pain management recently. He says that back before this recent surgery her blood pressure used to be low in the 100s over 70s. Says that now it is about 145/100 every time she goes to pain management.  She is also requesting refill on Valium but wants to decrease the dose and just wants to have some available if needed.   ---05/17/2016: She has multiple issues to address at today's visit: --- Needs referral to a new GI doctor: Says that she needs a GI doctor to help manage her current symptoms. Says that she is having episodes of it being as if her GI system gets totally stopped it and then suddenly goes. Is that during that time where it feels like everything stops she projectile vomits. An once things start to move again she has diarrhea. Says that these cycles were occurring every couple of months then every month and then every 2 weeks and are now getting more and more frequent. Says that she stopped gluten for a period of time hoping that would help. Kept a food diary. Nohing has helped and she continues with this cycle. Also needs a refill on her  Phenergan to use.  ----Needs referral to a new pain management.: Says that she was going to [facility] and most recently had been seeing Dr. Greta Doom. Says that her last office visit there she was informed that they no longer take Medicaid and can no longer prescribe her any further medications and provide any further care. Says that she is very upset because she has been seeing that same pain clinic for about 10 years and they know her very well. At 50 months of age she was found to have a very rare type of cancer which she says was managed at [place] that she had hysterectomy chemotherapy and radiation therapy at that time.  Because of the growth that occurred in her body after this treatment and the effects of this treatment, her body is not level and symmetrical and therefore has chronic pain in her back and abdomen.   She states that she still has one refill remaining on her Elavil and one refill remaining on her Flexeril. Does need prescription for oxycodone. She has her last bottle with her which reads take 1 pill 5 times a day and was for #150 and was dispensed on 04/20/16.  Today she tells me that she has been raising her sister's children. She has pictures of them on her phone that she shows me. She also tells me that she has gotten her "dream job and loves it " Is working at [employer]. However says that she needs to make sure that she keeps that job and does not lose that job because of these GI symptoms and also that it is embarrassing that she is having these GI symptoms. Says at the end of September she will be eligible to get on her company's insurance and may not have to have Medicaid after that point. However says that DSS informed her that because she has 3 dependents she may still requalify for Medicaid-- she is not sure. She is in very good spirits today.   10/24/2016: She states that recently for a period of time she was  without insurance. During that time her blood pressure medicine was not continued. Notes the blood pressure is reading high today off of medicines that she needs to get back on blood pressure medicine which was amlodipine 5 mg daily. She also states that she is having horrible hot flashes. Says that she was seeing the same GYN for 16 years and has called them to get appointment but it's going to take her 4 months before she can be seen there. Goes to [facility] and sees Elon Alas. Says the GYN will not refill her hormone therapy until she has office visit. Questing that I give her refills until she can follow-up with GYN. She has already picked up her pain medicines December 12. Is not out of these. No other concerns to address today.   01/28/2017: She reports that she has recently been having burning when she urinates, urinating very frequently and has also just started noticing that her urine looks bloody. She has had no fevers or chills and no back pain. She also reports that with her new insurance plan that the Premarin is a very high tier and high cost, but the Estrace is the low tier/ low cost preferred medicine. Requesting that I change this. Says that her co-pay to see GYN a $75 so she is waiting to get tax money back before she can schedule that visit. Says that she has been on this medication since she was 36 years old. She states that she already has prescription for pain medicine and is not due to get new prescription on that yet.  She states that she did resume her BP med. Is taking this daily. No adverse effects. No other concerns to address today.   06/13/2017: Today patient is in very good spirits. Says that things have been going very well at her job and she is really enjoying her job at [employer]. Names off multiple activities/special events they have planned. Also states that she and the children have moved into a new apartment that has more space. She states that both  kids will be in  middle school this year (biologic niece and nephew)--- one of them was already at the middle school but the other one is moving up to the middle school--- so she says this will all be an adjustment/new journey. Says that the kids are in [place] with her parents for 2 weeks right now so she has had 2 weeks to do what she wants/free from kids. She states that all of her medical issues have been stable and the been doing well. Her pain is controlled with current pain medications. Her hot flashes are controlled with current medications. She is taking blood pressure medication as directed and is having no lightheadedness or other adverse effects. She has no concerns to address today.    Past Medical History:  Diagnosis Date  . Asthma    childhood - no prob as adult  . Back pain   . 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 Depression    history - no meds  . Endodermal sinus tumor   . Enterocolitis   . Osteopenia   . Scoliosis   . 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 amitriptyline (ELAVIL) 50 MG tablet TAKE 1 TABLET BY MOUTH AT BEDTIME. 30 tablet 0  . amLODipine (NORVASC) 5 MG tablet Take 1 tablet (5 mg total) by mouth daily. 30 tablet 5  . cyclobenzaprine (FLEXERIL) 10 MG tablet TAKE (1) TABLET BY MOUTH THREE TIMES DAILY AS NEEDED. 60 tablet 5  . estradiol (ESTRACE) 0.5 MG tablet Take 1 tablet (0.5 mg total) by mouth daily. 30 tablet 11  . oxyCODONE (ROXICODONE) 15 MG immediate release tablet Take 1 tablet (15 mg total) by mouth every 4 (four) hours as needed. Must last 30 days. 150 tablet 0  . promethazine (PHENERGAN) 25 MG suppository Place 1 suppository (25 mg total) rectally every 6 (six) hours as needed for nausea or vomiting. 12 each 0  . promethazine (PHENERGAN) 25 MG tablet TAKE 1 TABLET BY MOUTH EVERY 8 HOURS AS NEEDED FOR NAUSEA AND  VOMITING. 30 tablet 2  . nitrofurantoin, macrocrystal-monohydrate, (MACROBID) 100 MG capsule Take 1 capsule (100 mg total) by mouth 2 (two) times daily. 10 capsule 0   No facility-administered medications prior to visit.     Allergies:  Allergies  Allergen Reactions  . Dog Epithelium Itching    Also Cat dander and Horse dander. Patient takes zyrtec as prophylactic.     Social History   Social History  . Marital status: Single    Spouse name: N/A  . Number of children: N/A  . Years of education: N/A   Occupational History  . Not on file.   Social History Main Topics  . Smoking status: Former Smoker    Packs/day: 0.25    Years: 10.00    Types: E-cigarettes    Quit date: 06/13/2014  . Smokeless tobacco: Never Used     Comment: vapor cigs  . Alcohol use Yes     Comment: socially  . Drug use: No  . Sexual activity: Yes    Birth control/ protection: Surgical   Other Topics Concern  . Not on file   Social History Narrative  . No narrative on file    Family History  Problem Relation Age of Onset  . Diabetes Father   . Breast cancer Paternal Grandmother   . Diabetes Paternal Grandmother  Review of Systems:  See HPI for pertinent ROS. All other ROS negative.    Physical Exam: Blood pressure 130/88, pulse 96, temperature 97.9 F (36.6 C), temperature source Oral, resp. rate 18, weight 118 lb 6.4 oz (53.7 kg), last menstrual period 08/15/1984, SpO2 98 %., Body mass index is 22.37 kg/m. General: WNWD WF. Appears in no acute distress. Neck: Supple. No thyromegaly. No lymphadenopathy. Lungs: Clear bilaterally to auscultation without wheezes, rales, or rhonchi. Breathing is unlabored. Heart: RRR with S1 S2. No murmurs, rubs, or gallops. Musculoskeletal:  Strength and tone normal for age. Extremities/Skin: Warm and dry.  She has scars on her right low back and scars on her abdomen.  Neuro: Alert and oriented X 3. Moves all extremities spontaneously. Gait is normal.  CNII-XII grossly in tact. Psych:  Responds to questions appropriately with a normal affect.     ASSESSMENT AND PLAN:  37 y.o. year old female with   Chronic pain syndrome 01/28/2017: Since she is leaving a urine sample today, I will run this for a urine drug screen to have this documentation. She already has her pain meds and is not due for prescription yet she will call and needs next Rx. - Drug Scr Ur, Pain Mgmt, Reflex Conf ---In the past--- she was having issues regarding getting off work, getting here to pick up prescription, and getting prescription filled in timely manner ---At that time I told her that she can start calling me about a     week in advance and we can print prescription with note stating "do not fill until----" ---In addition to this medication the pain clinic was also prescribing her Elavil and Flexeril.      I will cont to refill these.   ADDENDUM ADDED 06/25/2016: Received records from Pain Management Dr. Franco Collet OV note dated 03/22/2016. It is a normal note.  Makes no mention of patient misusing medications.  At that [place] she was given Rx for oxycodone 15mg  -- take 1 tablet by mouth 5 times a day as needed for pain   # 150 + 0.  That note states "patient is instructed to follow-up in one month for pain reevaluation or sooner as needed"  Note dated 02/23/16 includes the following information --- "urine drug screen obtained at the last visit was consistent. No aberrent behavior noted"  At prior OV I placed a referral to pain clinic.  I will continue to prescribe her pain meds for now.  06/13/2017:  Printed Rx for her pain medication "do not fill until 06/14/17, 1 for do not fill until 07/15/17, 1 for "do not fill until 08/14/17."  Essential hypertension 06/13/2017: Blood Pressure is at goal/controlled. Continue current medication.  Hot flashes 01/28/2017: Will change her HRT to Estrace. She will f/u with Gyn when she has the finances to do so - estradiol (ESTRACE)  0.5 MG tablet; Take 1 tablet (0.5 mg total) by mouth daily.  Dispense: 30 tablet; Refill: 11 06/13/2017--- hot flashes are stable.  Routine office visit 3 months. Call when needs next prescription printed.--And future date with "do not fill until--"  Signed, [address] [place], [place], [facility] 06/13/2017 4:20 PM

## 2017-09-11 ENCOUNTER — Encounter: Payer: Self-pay | Admitting: Physician Assistant

## 2017-09-11 ENCOUNTER — Ambulatory Visit (INDEPENDENT_AMBULATORY_CARE_PROVIDER_SITE_OTHER): Payer: 59 | Admitting: Physician Assistant

## 2017-09-11 VITALS — BP 136/84 | HR 101 | Temp 98.1°F | Resp 14 | Ht 61.0 in | Wt 117.0 lb

## 2017-09-11 DIAGNOSIS — I10 Essential (primary) hypertension: Secondary | ICD-10-CM

## 2017-09-11 DIAGNOSIS — F4322 Adjustment disorder with anxiety: Secondary | ICD-10-CM

## 2017-09-11 DIAGNOSIS — K529 Noninfective gastroenteritis and colitis, unspecified: Secondary | ICD-10-CM | POA: Diagnosis not present

## 2017-09-11 DIAGNOSIS — G894 Chronic pain syndrome: Secondary | ICD-10-CM

## 2017-09-11 DIAGNOSIS — G4700 Insomnia, unspecified: Secondary | ICD-10-CM

## 2017-09-11 DIAGNOSIS — R232 Flushing: Secondary | ICD-10-CM | POA: Diagnosis not present

## 2017-09-11 MED ORDER — OXYCODONE HCL 15 MG PO TABS: 15.0000 mg | ORAL_TABLET | ORAL | 0 refills | Status: DC | PRN

## 2017-09-11 MED ORDER — PROMETHAZINE HCL 25 MG RE SUPP: 25.0000 mg | Freq: Four times a day (QID) | RECTAL | 11 refills | Status: DC | PRN

## 2017-09-11 NOTE — Progress Notes (Addendum)
Patient ID: Pamela Wall MRN: [id number], DOB: 09/30/1981, 36 y.o. Date of Encounter: @DATE @  Chief Complaint:  Chief Complaint  Patient presents with  . 3 month routine follow up    HPI: 36 y.o. year old white female  Presents with above.     THE FOLLOWING WAS DOCUMENTED AT HER OV NOTE WITH ME ON---- 04/12/2014:   She says that she was went to [facility] for a laparoscopic cholecystectomy. She was to have the procedure and go home the next day. However apparently once they got in, they found that her gallbladder was behind the liver and was adhered to the liver. They ended up doing an open cholecystectomy and patient says that they punctured her liver. Says that she was in [place] from 02/12/2014 - 02/19/14 and was transferred to [facility] from there. Was at [facility] from 4/17 through 4/24. She is scheduled for her next procedure 04/26/14--this is to place a stent in the bile duct. She also knows that she will have to have at least one other procedure to remove the current stent that she already has placed. As well she thinks that she has an incisional hernia. She and her grandmother report that the incision site looked like it does now as soon as they removed the staples -- this was done at [place] that she is seeing a Dr. Carlis Abbott, Dr. Amalia Hailey, Dr. Penelope Galas at [facility]. Also seeing a doctor [place]--pain management. Says that she had an MRI last week and has an appointment to see Dr. Carlis Abbott 6/22 prior to the stent procedure.  Says that Dr. Greta Doom at the pain clinic is prescribing the cyclobenzaprine and oxycodone.  Says one of the doctors at [facility] has been prescribing the Valium. However she has been unable to get in touch with him to get refills. Says the 10 mg is too strong and " knocks her out". Says a half of a pill (5 mg) works perfectly. As the prior to all of this she never had any problems with anxiety. Also never had problems with insomnia. Currently he is  prescribed amitriptyline for the insomnia. This is being prescribed by Dr. Greta Doom the pain clinic. Says prior to the amitriptyline she wasn't getting any sleep. With the amitriptyline, she is able to get 4 hours of sleep but that is all.  She says that prior to this she was very active and functional.  She has 2 children ages 106 and age  19.  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 She and her kids are staying with her grandmother who is with her at the visit today.   THE FOLLOWING IS FROM HER OV NOTE WITH ME ----05/20/2014:   Today she she reports that when she went for followup at [facility] that they were able to remove the stent and did not require any further surgeries. Also says that they felt the protrusion at the right upper quadrant may be secondary to layer of adipose tissue which was not pressed down firmly prior to the sutures being placed-- or  felt there could possibly be a small area of hernia -- plan to just monitor this for now. Patient states that they went down her throat to remove the stent. Says that she has had no further abdominal surgeries since her last visit with me.  However, for some reason, she developed swelling of both of her legs from July 13 -  July 15. She says that both her ankles and knees appeared swollen as big as her calves. However last night she elevated her legs and applied ice and this morning the swelling has resolved.  Also, she says that she is trying to wean down the diazepam but is wondering is she can get a refill. Says that she does still have times of feeling as if she is "shaking on the inside "and also having difficulty sleeping.   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  She had a hysterectomy secondary to cancer at age 65 months old. She says that she also had radiation therapy. It was later documented that the radiation had  "killed her ovaries". Says that she was on hormone replacement therapy to go through puberty. Later after that the hormone replacement therapy was used off and on. Says that when she was in her 43s she experienced hot flashes so they treated this with Premarin and Estratest. Says that they wanted to limit the amount of hormones that she was on so was eventually discontinued. Says that she went for years without any menopausal symptoms. However she says that she is now having significant sweats at night--even soaking her bed sheets--says that these are the exact same symptoms that she had had in the past. She recently called here about referral to GYN which I had approved. She is wanting to go ahead and get on some hormones to use while she waits for that appointment.  She also reports that her blood pressure has been a little bit high when she has gone to pain management recently. He says that back before this recent surgery her blood pressure used to be low in the 100s over 70s. Says that now it is about 145/100 every time she goes to pain management.  She is also requesting refill on Valium but wants to decrease the dose and just wants to have some available if needed.   ---05/17/2016: She has multiple issues to address at today's visit: --- Needs referral to a new GI doctor: Says that she is having episodes of it being as if her GI system gets totally stopped it and then suddenly goes. Is that during that time where it feels like everything stops she projectile vomits. An once things start to move again she has diarrhea. Says that these cycles were occurring every couple of months then every month and then every 2 weeks and are now getting more and more frequent. Says that she stopped gluten for a period of time hoping that would help. Kept a food diary. Nohing has helped and she continues with this cycle. Also needs a refill on her Phenergan to use.  ----Needs referral to a new pain  management.: Says that she was going to [facility] and most recently had been seeing Dr. Greta Doom. Says that her last office visit there she was informed that they no longer take Medicaid and can no longer prescribe her any further medications and provide any further care. Says that she is very upset because she has been seeing that same pain clinic for about 10 years and they know her very well. At 22 months of age she was found to have a very rare type of cancer which she says was managed at [facility] that she had hysterectomy chemotherapy and radiation therapy at that time. Because of the growth that occurred in her body after this  treatment and the effects of this treatment, her body is not level and symmetrical and therefore has chronic pain in her back and abdomen.   She states that she still has one refill remaining on her Elavil and one refill remaining on her Flexeril. Does need prescription for oxycodone. She has her last bottle with her which reads take 1 pill 5 times a day and was for #150 and was dispensed on 04/20/16.  Today she tells me that she has been raising her sister's children. She has pictures of them on her phone that she shows me. She also tells me that she has gotten her "dream job and loves it " Is working at [employer]. However says that she needs to make sure that she keeps that job and does not lose that job because of these GI symptoms and also that it is embarrassing that she is having these GI symptoms. Says at the end of September she will be eligible to get on her company's insurance and may not have to have Medicaid after that point. However says that DSS informed her that because she has 3 dependents she may still requalify for Medicaid-- she is not sure. She is in very good spirits today.   10/24/2016: She states that recently for a period of time she was without insurance. During that time her blood  pressure medicine was not continued. Notes the blood pressure is reading high today off of medicines that she needs to get back on blood pressure medicine which was amlodipine 5 mg daily. She also states that she is having horrible hot flashes. Says that she was seeing the same GYN for 16 years and has called them to get appointment but it's going to take her 4 months before she can be seen there. Goes to [facility] and sees Elon Alas. Says the GYN will not refill her hormone therapy until she has office visit. Questing that I give her refills until she can follow-up with GYN. She has already picked up her pain medicines December 12. Is not out of these. No other concerns to address today.   01/28/2017: She reports that she has recently been having burning when she urinates, urinating very frequently and has also just started noticing that her urine looks bloody. She has had no fevers or chills and no back pain. She also reports that with her new insurance plan that the Premarin is a very high tier and high cost, but the Estrace is the low tier/ low cost preferred medicine. Requesting that I change this. Says that her co-pay to see GYN a $75 so she is waiting to get tax money back before she can schedule that visit. Says that she has been on this medication since she was 36 years old. She states that she already has prescription for pain medicine and is not due to get new prescription on that yet.  She states that she did resume her BP med. Is taking this daily. No adverse effects. No other concerns to address today.   06/13/2017: Today patient is in very good spirits. Says that things have been going very well at her job and she is really enjoying her job at [employer]. Names off multiple activities/special events they have planned. Also states that she and the children have moved into a new apartment that has more space. She states that both kids will be in middle school this year  (biologic niece and nephew)--- one of them  was already at the middle school but the other one is moving up to the middle school--- so she says this will all be an adjustment/new journey. Says that the kids are in [place] with her parents for 2 weeks right now so she has had 2 weeks to do what she wants/free from kids. She states that all of her medical issues have been stable and the been doing well. Her pain is controlled with current pain medications. Her hot flashes are controlled with current medications. She is taking blood pressure medication as directed and is having no lightheadedness or other adverse effects. She has no concerns to address today.  09/11/2017: Today Ms. Breithaupt is doing very well and is in very good spirits.  She names off multiple events that she has coming up at her job at [employer].  She also states that the 2 kids are doing well in middle school.  Says that the older one actually will be starting high school in the next year or two.  Again today she reports that her medical issues have all been stable and well controlled. Her pain is controlled with current pain medications. Her hot flashes are controlled with current medications. She is taking blood pressure medication with no lightheadedness or other adverse effects. She has no specific concerns to address today.     Past Medical History:  Diagnosis Date  . Asthma    childhood - no prob as adult  . Back pain   . 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 Depression    history - no meds  . Endodermal sinus tumor   . Enterocolitis   . Osteopenia   . Scoliosis   . 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 amitriptyline (ELAVIL) 50 MG tablet TAKE 1 TABLET BY MOUTH AT BEDTIME. 30 tablet 0  . cyclobenzaprine (FLEXERIL) 10 MG tablet TAKE (1) TABLET BY MOUTH THREE TIMES DAILY AS  NEEDED. 60 tablet 5  . estradiol (ESTRACE) 0.5 MG tablet Take 1 tablet (0.5 mg total) by mouth daily. 30 tablet 11  . oxyCODONE (ROXICODONE) 15 MG immediate release tablet Take 1 tablet (15 mg total) by mouth every 4 (four) hours as needed. Must last 30 days. 150 tablet 0  . promethazine (PHENERGAN) 25 MG tablet TAKE 1 TABLET BY MOUTH EVERY 8 HOURS AS NEEDED FOR NAUSEA AND VOMITING. 30 tablet 2  . amLODipine (NORVASC) 5 MG tablet Take 1 tablet (5 mg total) by mouth daily. (Patient not taking: Reported on 09/11/2017) 30 tablet 5  . promethazine (PHENERGAN) 25 MG suppository Place 1 suppository (25 mg total) rectally every 6 (six) hours as needed for nausea or vomiting. 12 each 0   No facility-administered medications prior to visit.     Allergies:  Allergies  Allergen Reactions  . Dog Epithelium Itching    Also Cat dander and Horse dander. Patient takes zyrtec as prophylactic.     Social History   Socioeconomic History  . Marital status: Single    Spouse name: Not on file  . Number of children: Not on file  . Years of education: Not on file  . Highest education level: Not on file  Social Needs  . Financial resource strain: Not on file  . Food insecurity - worry: Not on file  . Food insecurity - inability: Not on file  .  Transportation needs - medical: Not on file  . Transportation needs - non-medical: Not on file  Occupational History  . Not on file  Tobacco Use  . Smoking status: Former Smoker    Packs/day: 0.25    Years: 10.00    Pack years: 2.50    Types: E-cigarettes    Last attempt to quit: 06/13/2014    Years since quitting: 3.2  . Smokeless tobacco: Never Used  . Tobacco comment: vapor cigs  Substance and Sexual Activity  . Alcohol use: Yes    Comment: socially  . Drug use: No  . Sexual activity: Yes    Birth control/protection: Surgical  Other Topics Concern  . Not on file  Social History Narrative  . Not on file    Family History  Problem Relation Age of  Onset  . Diabetes Father   . Breast cancer Paternal Grandmother   . Diabetes Paternal Grandmother      Review of Systems:  See HPI for pertinent ROS. All other ROS negative.    Physical Exam: Blood pressure 136/84, pulse (!) 101, temperature 98.1 F (36.7 C), temperature source Oral, resp. rate 14, height 5\' 1"  (1.549 m), weight 53.1 kg (117 lb), last menstrual period 08/15/1984, SpO2 99 %., There is no height or weight on file to calculate BMI. General: WNWD WF. Appears in no acute distress. Neck: Supple. No thyromegaly. No lymphadenopathy. Lungs: Clear bilaterally to auscultation without wheezes, rales, or rhonchi. Breathing is unlabored. Heart: RRR with S1 S2. No murmurs, rubs, or gallops. Musculoskeletal:  Strength and tone normal for age. Extremities/Skin: Warm and dry.  She has scars on her right low back and scars on her abdomen.  Neuro: Alert and oriented X 3. Moves all extremities spontaneously. Gait is normal. CNII-XII grossly in tact. Psych:  Responds to questions appropriately with a normal affect.     ASSESSMENT AND PLAN:  36 y.o. year old female with   Chronic pain syndrome 01/28/2017: Since she is leaving a urine sample today, I will run this for a urine drug screen to have this documentation. She already has her pain meds and is not due for prescription yet she will call and needs next Rx. - Drug Scr Ur, Pain Mgmt, Reflex Conf ---In the past--- she was having issues regarding getting off work, getting here to pick up prescription, and getting prescription filled in timely manner ---At that time I told her that she can start calling me about a     week in advance and we can print prescription with note stating "do not fill until----" ---In addition to this medication the pain clinic was also prescribing her Elavil and Flexeril.      I will cont to refill these.   ADDENDUM ADDED 06/25/2016: Received records from Pain Management Dr. Franco Collet OV note dated  03/22/2016. It is a normal note.  Makes no mention of patient misusing medications.  At that [facility] she was given Rx for oxycodone 15mg  -- take 1 tablet by mouth 5 times a day as needed for pain   # 150 + 0.  That note states "patient is instructed to follow-up in one month for pain reevaluation or sooner as needed"  Note dated 02/23/16 includes the following information --- "urine drug screen obtained at the last visit was consistent. No aberrent behavior noted"  At prior OV I placed a referral to pain clinic.  I will continue to prescribe her pain meds for now.  09/11/2017:  Drug Registry  printed and reviewed.  She has received no other controlled substances.  I am the only prescriber.  Only one controlled substance.  Fill dates have been appropriate.  Recent fill dates were 06/15/17, 07/15/17, 08/14/17 Today I printed 3 Rxes for her pain medication "do not fill until 09/14/17, 1 for do not fill until 10/14/17, 1 for "do not fill until 11/14/2017."  Essential hypertension 09/11/2017: Blood Pressure is at goal/controlled. Continue current medication.  Hot flashes 01/28/2017: Will change her HRT to Estrace. She will f/u with Gyn when she has the finances to do so - estradiol (ESTRACE) 0.5 MG tablet; Take 1 tablet (0.5 mg total) by mouth daily.  Dispense: 30 tablet; Refill: 11 09/11/2017--- hot flashes are stable.  09/11/2017: Discussed flu vaccine.  She states that in the past she had an allergic reaction to part of the mixture so refuses to get any further flu vaccine.  Routine office visit 3 months.    Signed, [address] [place], [place], [facility] 09/11/2017 8:16 AM

## 2017-09-19 ENCOUNTER — Telehealth: Payer: Self-pay

## 2017-09-19 MED ORDER — ALBUTEROL SULFATE HFA 108 (90 BASE) MCG/ACT IN AERS: INHALATION_SPRAY | RESPIRATORY_TRACT | 1 refills | Status: DC

## 2017-09-19 NOTE — Telephone Encounter (Signed)
Approved.  Ventolin HFA 2 puffs every 4-6 hours as needed for wheezing.  Dispense #1+1 refills.

## 2017-09-19 NOTE — Telephone Encounter (Signed)
RX sent- patient aware  

## 2017-09-19 NOTE — Telephone Encounter (Signed)
Patient called and left a message requesting a rescue inhaler patient states she turned on her heater and started to experience wheezing as well as SOB. Patient states she has had this in the past and is not sick. Patient states she has an albuterol inhaler but it is old and would like another rx for one if possible. Pls advise

## 2017-10-11 ENCOUNTER — Other Ambulatory Visit: Payer: Self-pay

## 2017-10-11 MED ORDER — CYCLOBENZAPRINE HCL 10 MG PO TABS: ORAL_TABLET | ORAL | 5 refills | Status: DC

## 2017-10-11 NOTE — Telephone Encounter (Signed)
Ok to refill 

## 2017-10-11 NOTE — Telephone Encounter (Signed)
rx sent to pharmacy

## 2017-10-11 NOTE — Telephone Encounter (Signed)
Approved. # 60 + 5.

## 2017-11-14 ENCOUNTER — Other Ambulatory Visit: Payer: Self-pay

## 2017-11-14 MED ORDER — AMITRIPTYLINE HCL 50 MG PO TABS: 50.0000 mg | ORAL_TABLET | Freq: Every day | ORAL | 0 refills | Status: DC

## 2017-11-14 MED ORDER — CYCLOBENZAPRINE HCL 10 MG PO TABS: ORAL_TABLET | ORAL | 5 refills | Status: DC

## 2017-11-14 NOTE — Telephone Encounter (Signed)
Refill appropriate 

## 2017-11-15 ENCOUNTER — Other Ambulatory Visit: Payer: Self-pay | Admitting: Physician Assistant

## 2017-11-18 NOTE — Telephone Encounter (Signed)
Refill appropriate 

## 2017-12-11 ENCOUNTER — Other Ambulatory Visit: Payer: Self-pay | Admitting: Physician Assistant

## 2017-12-11 DIAGNOSIS — G894 Chronic pain syndrome: Secondary | ICD-10-CM

## 2017-12-11 MED ORDER — OXYCODONE HCL 15 MG PO TABS: 15.0000 mg | ORAL_TABLET | ORAL | 0 refills | Status: DC | PRN

## 2017-12-11 MED ORDER — AMITRIPTYLINE HCL 50 MG PO TABS: 50.0000 mg | ORAL_TABLET | Freq: Every day | ORAL | 0 refills | Status: DC

## 2017-12-11 NOTE — Telephone Encounter (Signed)
Appropved

## 2017-12-11 NOTE — Telephone Encounter (Signed)
Approved. Rx sent via imprivata.

## 2017-12-11 NOTE — Telephone Encounter (Signed)
Pt needs refill on oxycodone and amitriptyline [facility] has app for Monday.

## 2017-12-16 ENCOUNTER — Other Ambulatory Visit: Payer: Self-pay

## 2017-12-16 ENCOUNTER — Ambulatory Visit (INDEPENDENT_AMBULATORY_CARE_PROVIDER_SITE_OTHER): Payer: 59 | Admitting: Physician Assistant

## 2017-12-16 ENCOUNTER — Encounter: Payer: Self-pay | Admitting: Physician Assistant

## 2017-12-16 VITALS — BP 144/88 | HR 107 | Temp 98.0°F | Resp 18 | Ht 61.0 in | Wt 116.8 lb

## 2017-12-16 DIAGNOSIS — F4322 Adjustment disorder with anxiety: Secondary | ICD-10-CM

## 2017-12-16 DIAGNOSIS — G894 Chronic pain syndrome: Secondary | ICD-10-CM

## 2017-12-16 DIAGNOSIS — R232 Flushing: Secondary | ICD-10-CM | POA: Diagnosis not present

## 2017-12-16 DIAGNOSIS — G4700 Insomnia, unspecified: Secondary | ICD-10-CM

## 2017-12-16 DIAGNOSIS — I10 Essential (primary) hypertension: Secondary | ICD-10-CM | POA: Diagnosis not present

## 2017-12-16 MED ORDER — OSELTAMIVIR PHOSPHATE 75 MG PO CAPS: ORAL_CAPSULE | ORAL | 0 refills | Status: DC

## 2017-12-16 MED ORDER — OXYCODONE HCL 15 MG PO TABS: 15.0000 mg | ORAL_TABLET | ORAL | 0 refills | Status: DC | PRN

## 2017-12-16 NOTE — Progress Notes (Addendum)
Patient ID: Pamela Wall MRN: [id number], DOB: [birth year], 37 y.o. Date of Encounter: @DATE @  Chief Complaint:  Chief Complaint  Patient presents with  . 6 month follow up    HPI: 37 y.o. year old white female  Presents with above.     THE FOLLOWING WAS DOCUMENTED AT HER OV NOTE WITH ME ON---- 04/12/2014:   She says that she was went to [facility] for a laparoscopic cholecystectomy. She was to have the procedure and go home the next day. However apparently once they got in, they found that her gallbladder was behind the liver and was adhered to the liver. They ended up doing an open cholecystectomy and patient says that they punctured her liver. Says that she was in [place] from 02/12/2014 - 02/19/14 and was transferred to [facility] from there. Was at [facility] from 4/17 through 4/24. She is scheduled for her next procedure 04/26/14--this is to place a stent in the bile duct. She also knows that she will have to have at least one other procedure to remove the current stent that she already has placed. As well she thinks that she has an incisional hernia. She and her grandmother report that the incision site looked like it does now as soon as they removed the staples -- this was done at [place] that she is seeing a Dr. Carlis Abbott, Dr. Amalia Hailey, Dr. Penelope Galas at [facility]. Also seeing a doctor [place]--pain management. Says that she had an MRI last week and has an appointment to see Dr. Carlis Abbott 6/22 prior to the stent procedure.  Says that Dr. Greta Doom at the pain clinic is prescribing the cyclobenzaprine and oxycodone.  Says one of the doctors at [facility] has been prescribing the Valium. However she has been unable to get in touch with him to get refills. Says the 10 mg is too strong and " knocks her out". Says a half of a pill (5 mg) works perfectly. As the prior to all of this she never had any problems with anxiety. Also never had problems with insomnia. Currently he is prescribed  amitriptyline for the insomnia. This is being prescribed by Dr. Greta Doom the pain clinic. Says prior to the amitriptyline she wasn't getting any sleep. With the amitriptyline, she is able to get 4 hours of sleep but that is all.  She says that prior to this she was very active and functional.  She has 2 children ages 66 and age  16.  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 She and her kids are staying with her grandmother who is with her at the visit today.   THE FOLLOWING IS FROM HER OV NOTE WITH ME ----05/20/2014:   Today she she reports that when she went for followup at [facility] that they were able to remove the stent and did not require any further surgeries. Also says that they felt the protrusion at the right upper quadrant may be secondary to layer of adipose tissue which was not pressed down firmly prior to the sutures being placed-- or  felt there could possibly be a small area of hernia -- plan to just monitor this for now. Patient states that they went down her throat to remove the stent. Says that she has had no further abdominal surgeries since her last visit with me.  However, for some reason, she developed swelling of both of her legs from July 13 -  July  15. She says that both her ankles and knees appeared swollen as big as her calves. However last night she elevated her legs and applied ice and this morning the swelling has resolved.  Also, she says that she is trying to wean down the diazepam but is wondering is she can get a refill. Says that she does still have times of feeling as if she is "shaking on the inside "and also having difficulty sleeping.   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  She had a hysterectomy secondary to cancer at age 35 months old. She says that she also had radiation therapy. It was later documented that the radiation had "killed her  ovaries". Says that she was on hormone replacement therapy to go through puberty. Later after that the hormone replacement therapy was used off and on. Says that when she was in her 44s she experienced hot flashes so they treated this with Premarin and Estratest. Says that they wanted to limit the amount of hormones that she was on so was eventually discontinued. Says that she went for years without any menopausal symptoms. However she says that she is now having significant sweats at night--even soaking her bed sheets--says that these are the exact same symptoms that she had had in the past. She recently called here about referral to GYN which I had approved. She is wanting to go ahead and get on some hormones to use while she waits for that appointment.  She also reports that her blood pressure has been a little bit high when she has gone to pain management recently. He says that back before this recent surgery her blood pressure used to be low in the 100s over 70s. Says that now it is about 145/100 every time she goes to pain management.  She is also requesting refill on Valium but wants to decrease the dose and just wants to have some available if needed.   ---05/17/2016: She has multiple issues to address at today's visit: --- Needs referral to a new GI doctor: Says that she needs a new GI doctor to help manage her current symptoms. Says that she is having episodes of it being as if her GI system gets totally stopped it and then suddenly goes. Is that during that time where it feels like everything stops she projectile vomits. An once things start to move again she has diarrhea. Says that these cycles were occurring every couple of months then every month and then every 2 weeks and are now getting more and more frequent. Says that she stopped gluten for a period of time hoping that would help. Kept a food diary. Nohing has helped and she continues with this cycle. Also needs a refill on her  Phenergan to use.  ----Needs referral to a new pain management.: Says that she was going to [facility] and most recently had been seeing Dr. Greta Doom. Says that her last office visit there she was informed that they no longer take Medicaid and can no longer prescribe her any further medications and provide any further care. Says that she is very upset because she has been seeing that same pain clinic for about 10 years and they know her very well. At 74 months of age she was found to have a very rare type of cancer which she says was managed at [facility] that she had hysterectomy chemotherapy and radiation therapy at  that time. Because of the growth that occurred in her body after this treatment and the effects of this treatment, her body is not level and symmetrical and therefore has chronic pain in her back and abdomen.   She states that she still has one refill remaining on her Elavil and one refill remaining on her Flexeril. Does need prescription for oxycodone. She has her last bottle with her which reads take 1 pill 5 times a day and was for #150 and was dispensed on 04/20/16.  Today she tells me that she has been raising her sister's children. She has pictures of them on her phone that she shows me. She also tells me that she has gotten her "dream job and loves it " Is working at [employer]. However says that she needs to make sure that she keeps that job and does not lose that job because of these GI symptoms and also that it is embarrassing that she is having these GI symptoms. Says at the end of September she will be eligible to get on her company's insurance and may not have to have Medicaid after that point. However says that DSS informed her that because she has 3 dependents she may still requalify for Medicaid-- she is not sure. She is in very good spirits today.   10/24/2016: She states that recently for a period of time she was  without insurance. During that time her blood pressure medicine was not continued. Notes the blood pressure is reading high today off of medicines that she needs to get back on blood pressure medicine which was amlodipine 5 mg daily. She also states that she is having horrible hot flashes. Says that she was seeing the same GYN for 16 years and has called them to get appointment but it's going to take her 4 months before she can be seen there. Goes to [facility] and sees Elon Alas. Says the GYN will not refill her hormone therapy until she has office visit. Questing that I give her refills until she can follow-up with GYN. She has already picked up her pain medicines December 12. Is not out of these. No other concerns to address today.   01/28/2017: She reports that she has recently been having burning when she urinates, urinating very frequently and has also just started noticing that her urine looks bloody. She has had no fevers or chills and no back pain. She also reports that with her new insurance plan that the Premarin is a very high tier and high cost, but the Estrace is the low tier/ low cost preferred medicine. Requesting that I change this. Says that her co-pay to see GYN a $75 so she is waiting to get tax money back before she can schedule that visit. Says that she has been on this medication since she was 37 years old. She states that she already has prescription for pain medicine and is not due to get new prescription on that yet.  She states that she did resume her BP med. Is taking this daily. No adverse effects. No other concerns to address today.   06/13/2017: Today patient is in very good spirits. Says that things have been going very well at her job and she is really enjoying her job at [employer]. Names off multiple activities/special events they have planned. Also states that she and the children have moved into a new apartment that has more space. She states that both  kids will  be in middle school this year (biologic niece and nephew)--- one of them was already at the middle school but the other one is moving up to the middle school--- so she says this will all be an adjustment/new journey. Says that the kids are in [place] with her parents for 2 weeks right now so she has had 2 weeks to do what she wants/free from kids. She states that all of her medical issues have been stable and the been doing well. Her pain is controlled with current pain medications. Her hot flashes are controlled with current medications. She is taking blood pressure medication as directed and is having no lightheadedness or other adverse effects. She has no concerns to address today.  09/11/2017: Today Pamela Wall is doing very well and is in very good spirits.  She names off multiple events that she has coming up at her job at [employer].  She also states that the 2 kids are doing well in middle school.  Says that the older one actually will be starting high school in the next year or two.  Again today she reports that her medical issues have all been stable and well controlled. Her pain is controlled with current pain medications. Her hot flashes are controlled with current medications. She is taking blood pressure medication with no lightheadedness or other adverse effects. She has no specific concerns to address today.  12/16/2017: Pamela Wall presents for routine follow-up visit.  She states that her stepdad and her mom live in [place].  States that her "stepdad has stage IV lung and brain cancer.  Says that they recently did chemotherapy but it was not effective and because of the location of the cancer they cannot do any surgery. " States that he is now on palliative treatment.  Says that he also has been diagnosed with flu and pneumonia.  States that her mom also is diagnosed with positive flu.  She is getting ready to fly there.  Is requesting prophylactic dose of Tamiflu to take.   Also reports that the pharmacy where her last pain med was sent that the cost was a lot higher so I then offered to print her another prescription today.  I had then printed prescription for pain medicine as well as the Tamiflu and gave that to her then found out that she said she did go ahead and pay the increased cost and did fill the prior Rx from 12/11/17.  Meanwhile, I had already printed one Rx for her today that there was on the same Rx sheet as the Tamiflu that she would just keep that hard copy printed Rx to fill 01/08/18. Otherwise she states that things have been stable. Reports that the pain is continued to be controlled with current pain medication. Orts that the hot flashes are controlled with current medication. That she is taking blood pressure medication as directed and is having no adverse effect. Other specific concerns to address today.   Past Medical History:  Diagnosis Date  . Asthma    childhood - no prob as adult  . Back pain   . 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 Depression    history - no meds  . Endodermal sinus tumor   . Enterocolitis   . Osteopenia   . Scoliosis   . 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 albuterol (VENTOLIN HFA) 108 (90 Base) MCG/ACT inhaler INHALE 2 PUFFS INTO THE LUNGS EVERY 4 TO 6 HOURS AS NEEDED FOR WHEEZING 18 g 0  . amitriptyline (ELAVIL) 50 MG tablet Take 1 tablet (50 mg total) by mouth at bedtime. 30 tablet 0  . cyclobenzaprine (FLEXERIL) 10 MG tablet TAKE (1) TABLET BY MOUTH THREE TIMES DAILY AS NEEDED. 60 tablet 5  . estradiol (ESTRACE) 0.5 MG tablet Take 1 tablet (0.5 mg total) by mouth daily. 30 tablet 11  . promethazine (PHENERGAN) 25 MG suppository Place 1 suppository (25 mg total) every 6 (six) hours as needed rectally for nausea. 12 suppository 11  . oxyCODONE (ROXICODONE) 15 MG immediate  release tablet Take 1 tablet (15 mg total) by mouth every 4 (four) hours as needed. Must last 30 days. 150 tablet 0  . promethazine (PHENERGAN) 25 MG tablet TAKE 1 TABLET BY MOUTH EVERY 8 HOURS AS NEEDED FOR NAUSEA AND VOMITING. 30 tablet 2  . amLODipine (NORVASC) 5 MG tablet Take 1 tablet (5 mg total) by mouth daily. (Patient not taking: Reported on 12/16/2017) 30 tablet 5   No facility-administered medications prior to visit.     Allergies:  Allergies  Allergen Reactions  . Dog Epithelium Itching    Also Cat dander and Horse dander. Patient takes zyrtec as prophylactic.     Social History   Socioeconomic History  . Marital status: Single    Spouse name: Not on file  . Number of children: Not on file  . Years of education: Not on file  . Highest education level: Not on file  Social Needs  . Financial resource strain: Not on file  . Food insecurity - worry: Not on file  . Food insecurity - inability: Not on file  . Transportation needs - medical: Not on file  . Transportation needs - non-medical: Not on file  Occupational History  . Not on file  Tobacco Use  . Smoking status: Former Smoker    Packs/day: 0.25    Years: 10.00    Pack years: 2.50    Types: E-cigarettes    Last attempt to quit: 06/13/2014    Years since quitting: 3.5  . Smokeless tobacco: Never Used  . Tobacco comment: vapor cigs  Substance and Sexual Activity  . Alcohol use: Yes    Comment: socially  . Drug use: No  . Sexual activity: Yes    Birth control/protection: Surgical  Other Topics Concern  . Not on file  Social History Narrative  . Not on file    Family History  Problem Relation Age of Onset  . Diabetes Father   . Breast cancer Paternal Grandmother   . Diabetes Paternal Grandmother      Review of Systems:  See HPI for pertinent ROS. All other ROS negative.    Physical Exam: Blood pressure (!) 144/88, pulse (!) 107, temperature 98 F (36.7 C), temperature source Oral, resp. rate 18,  height 5\' 1"  (1.549 m), weight 53 kg (116 lb 12.8 oz), last menstrual period 08/15/1984, SpO2 98 %., Body mass index is 22.07 kg/m. General: WNWD WF. Appears in no acute distress. Neck: Supple. No thyromegaly. No lymphadenopathy. Lungs: Clear bilaterally to auscultation without wheezes, rales, or rhonchi. Breathing is unlabored. Heart: RRR with S1 S2. No murmurs, rubs, or gallops. Musculoskeletal:  Strength and tone normal for age. Extremities/Skin: Warm and dry.  She has scars on her right low back and scars on her abdomen.  Neuro: Alert  and oriented X 3. Moves all extremities spontaneously. Gait is normal. CNII-XII grossly in tact. Psych:  Responds to questions appropriately with a normal affect.     ASSESSMENT AND PLAN:  37 y.o. year old female with   Chronic pain syndrome 01/28/2017: Since she is leaving a urine sample today, I will run this for a urine drug screen to have this documentation. She already has her pain meds and is not due for prescription yet she will call and needs next Rx. - Drug Scr Ur, Pain Mgmt, Reflex Conf ---In the past--- she was having issues regarding getting off work, getting here to pick up prescription, and getting prescription filled in timely manner ---At that time I told her that she can start calling me about a     week in advance and we can print prescription with note stating "do not fill until----" ---In addition to this medication the pain clinic was also prescribing her Elavil and Flexeril.      I will cont to refill these.   ADDENDUM ADDED 06/25/2016: Received records from Pain Management Dr. Franco Collet OV note dated 03/22/2016. It is a normal note.  Makes no mention of patient misusing medications.  At that [facility] she was given Rx for oxycodone 15mg  -- take 1 tablet by mouth 5 times a day as needed for pain   # 150 + 0.  That note states "patient is instructed to follow-up in one month for pain reevaluation or sooner as needed"  Note dated  02/23/16 includes the following information --- "urine drug screen obtained at the last visit was consistent. No aberrent behavior noted"  At prior OV I placed a referral to pain clinic.  I will continue to prescribe her pain meds for now.  09/11/2017:  Drug Registry printed and reviewed.  She has received no other controlled substances.  I am the only prescriber.  Only one controlled substance.  Fill dates have been appropriate.  Recent fill dates were 06/15/17, 07/15/17, 08/14/17 Today I printed 3 Rxes for her pain medication "do not fill until 09/14/17, 1 for do not fill until 10/14/17, 1 for "do not fill until 11/14/2017."   12/16/2017:  I had sent a prescription electronically on 12/11/17. Today I printed a prescription which she will hold on to to fill 01/08/18. Today I discussed with her the new fingerprint system and to just make sure that we are aware of which pharmacy what is to go to in the future.  Essential hypertension 12/16/2017: Blood Pressure is at goal/controlled. Continue current medication.  Hot flashes 01/28/2017: Will change her HRT to Estrace. She will f/u with Gyn when she has the finances to do so - estradiol (ESTRACE) 0.5 MG tablet; Take 1 tablet (0.5 mg total) by mouth daily.  Dispense: 30 tablet; Refill: 11 12/16/2008--- hot flashes are stable.  12/16/2017: Printed her a prophylactic dose of Tamiflu.  Tamiflu 75 mg 1 p.o. daily times 10 days.  She is to start this today prior to going to [place].  Routine office visit 3 months.    [address] [place], [place], [facility] 12/16/2017 8:35 AM

## 2018-01-01 ENCOUNTER — Other Ambulatory Visit: Payer: Self-pay

## 2018-01-01 DIAGNOSIS — G894 Chronic pain syndrome: Secondary | ICD-10-CM

## 2018-01-01 NOTE — Telephone Encounter (Signed)
Approved to go ahead and fill now.

## 2018-01-01 NOTE — Telephone Encounter (Signed)
Call placed to [facility] spoke with Pamela Wall and confirmed they could go ahead and fill the rx dated 01/08/2018. Also called and lvm for patient confirming call had been made to the pharmacy

## 2018-01-01 NOTE — Telephone Encounter (Signed)
Patient called as well as stopped by the office. Her father's health is declining and she is leaving for [facility] 2/28 @8am . Patient states she is not out of Oxycodone however the refill date is 01/08/2018 and patient is requesting to get the rx filled early she will be in [place] until 01/19/2018 and when she spoke to the pharmacy in [place] that indicated they do not fill out of state medications.  Because the rx stated must last 30 days I spoke with Lanny Hurst at [facility]' pharmacy he stated they can not refill w/o permission  Pls advise

## 2018-01-07 ENCOUNTER — Encounter: Payer: Self-pay | Admitting: Physician Assistant

## 2018-01-18 ENCOUNTER — Other Ambulatory Visit: Payer: Self-pay | Admitting: Physician Assistant

## 2018-01-18 DIAGNOSIS — R232 Flushing: Secondary | ICD-10-CM

## 2018-01-30 ENCOUNTER — Other Ambulatory Visit: Payer: Self-pay | Admitting: Physician Assistant

## 2018-01-30 DIAGNOSIS — G894 Chronic pain syndrome: Secondary | ICD-10-CM

## 2018-01-30 NOTE — Telephone Encounter (Signed)
Patient last OV 12/16/2017 Last refill 01/01/2018 #150 prior to that patient had received a refill on 12/16/2017 #150 Ok to refill

## 2018-01-30 NOTE — Telephone Encounter (Signed)
Drug Registry was printed and reviewed.   The drug registry is all appropriate up until just this last most recent fill.  And this current refill request at this time. I reviewed my last office note.  At that time I had reviewed that her last prescription had been written on 12/11/17 but since she was here for a visit with me on 12/16/17, I went ahead and printed a prescription to give to her with the plan for her to fill that Rx on 01/08/18.  That prescription should not have been filled until  01/08/18.  The next prescription is not due until 02/08/18. I will fill this closer to 02/08/18.

## 2018-01-30 NOTE — Telephone Encounter (Signed)
Refill on oxycodone to [facility]  °

## 2018-01-31 NOTE — Telephone Encounter (Signed)
Called patient Pamela Wall

## 2018-02-04 ENCOUNTER — Encounter: Payer: Self-pay | Admitting: Physician Assistant

## 2018-02-06 ENCOUNTER — Other Ambulatory Visit: Payer: Self-pay

## 2018-02-06 DIAGNOSIS — G894 Chronic pain syndrome: Secondary | ICD-10-CM

## 2018-02-06 DIAGNOSIS — R232 Flushing: Secondary | ICD-10-CM

## 2018-02-06 MED ORDER — AMITRIPTYLINE HCL 50 MG PO TABS: 50.0000 mg | ORAL_TABLET | Freq: Every day | ORAL | 1 refills | Status: DC

## 2018-02-06 MED ORDER — CYCLOBENZAPRINE HCL 10 MG PO TABS: ORAL_TABLET | ORAL | 5 refills | Status: DC

## 2018-02-06 MED ORDER — ESTRADIOL 0.5 MG PO TABS: 0.5000 mg | ORAL_TABLET | Freq: Every day | ORAL | 2 refills | Status: DC

## 2018-02-06 MED ORDER — OXYCODONE HCL 15 MG PO TABS: 15.0000 mg | ORAL_TABLET | ORAL | 0 refills | Status: DC | PRN

## 2018-02-06 NOTE — Telephone Encounter (Signed)
Ok to refill? Oxycodone and flexeril?   Last refill oxycodone 01/08/2018 Flexeril 11/14/2017

## 2018-02-10 ENCOUNTER — Ambulatory Visit: Payer: 59 | Admitting: Physician Assistant

## 2018-02-26 ENCOUNTER — Other Ambulatory Visit: Payer: Self-pay | Admitting: Physician Assistant

## 2018-03-04 ENCOUNTER — Other Ambulatory Visit: Payer: Self-pay

## 2018-03-04 DIAGNOSIS — G894 Chronic pain syndrome: Secondary | ICD-10-CM

## 2018-03-04 NOTE — Telephone Encounter (Signed)
Last ov 12/16/2017 Last refill 01/08/2018 Ok to refill?

## 2018-03-05 MED ORDER — OXYCODONE HCL 15 MG PO TABS: 15.0000 mg | ORAL_TABLET | ORAL | 0 refills | Status: DC | PRN

## 2018-03-06 ENCOUNTER — Telehealth: Payer: Self-pay

## 2018-03-06 DIAGNOSIS — G894 Chronic pain syndrome: Secondary | ICD-10-CM

## 2018-03-06 NOTE — Telephone Encounter (Signed)
Approved to fill now.

## 2018-03-06 NOTE — Telephone Encounter (Signed)
Patient called and left a message asking if pharmacy can fill Oxycodone before 03/08/18 which is when the last rx was picked up. Patient states pharmacy told her because she is paying out of pocket this has to be authorized by PCP.Also patient states pharmacy is an hour away from her and she will not be able to go on Saturday . Pls advise

## 2018-03-06 NOTE — Telephone Encounter (Signed)
Spoke with Lanny Hurst at the pharmacy and he states if PCP is going to continue to allow patient to pick up rx early then *Must last 30 days* does not need to be written on the prescription

## 2018-03-10 NOTE — Telephone Encounter (Signed)
Call placed to patient to discuss provider recommendations regarding the referral to the pain clinic.[email or web address]

## 2018-03-10 NOTE — Telephone Encounter (Signed)
Please Place order for referral to pain clinic. Inform patient that I will continue to prescribe pain meds for now but I am going to work on getting her in with a pain clinic.

## 2018-03-11 NOTE — Telephone Encounter (Signed)
FYI: Call placed to patient she is not agreeable to go a pain clinic she stated she was going to one in the past and they no longer accepted her insurance and would not allow her to be a cash paying patient.Patient states she has to pay a co pay for a specialist and this is a medication that she feels would be life long and will discuss continuing to have PCP fill pain medication at next office visit on 03/26/2018

## 2018-03-26 ENCOUNTER — Ambulatory Visit: Payer: 59 | Admitting: Physician Assistant

## 2018-04-24 ENCOUNTER — Encounter: Payer: Self-pay | Admitting: Physician Assistant

## 2018-05-06 ENCOUNTER — Other Ambulatory Visit: Payer: Self-pay

## 2018-05-06 MED ORDER — ALBUTEROL SULFATE HFA 108 (90 BASE) MCG/ACT IN AERS: INHALATION_SPRAY | RESPIRATORY_TRACT | 0 refills | Status: DC

## 2018-06-02 ENCOUNTER — Other Ambulatory Visit: Payer: Self-pay | Admitting: Physician Assistant

## 2018-07-02 ENCOUNTER — Encounter: Payer: Self-pay | Admitting: Physician Assistant

## 2018-07-02 ENCOUNTER — Ambulatory Visit: Payer: 59 | Admitting: Physician Assistant

## 2018-07-02 ENCOUNTER — Ambulatory Visit: Payer: Self-pay | Admitting: Physician Assistant

## 2018-07-02 VITALS — BP 130/84 | HR 98 | Temp 98.4°F | Resp 18 | Ht 61.0 in | Wt 116.2 lb

## 2018-07-02 DIAGNOSIS — R232 Flushing: Secondary | ICD-10-CM

## 2018-07-02 DIAGNOSIS — I10 Essential (primary) hypertension: Secondary | ICD-10-CM

## 2018-07-02 DIAGNOSIS — G4700 Insomnia, unspecified: Secondary | ICD-10-CM

## 2018-07-02 DIAGNOSIS — G894 Chronic pain syndrome: Secondary | ICD-10-CM | POA: Diagnosis not present

## 2018-07-02 DIAGNOSIS — Z90710 Acquired absence of both cervix and uterus: Secondary | ICD-10-CM

## 2018-07-02 DIAGNOSIS — Z8542 Personal history of malignant neoplasm of other parts of uterus: Secondary | ICD-10-CM | POA: Insufficient documentation

## 2018-07-02 MED ORDER — ALBUTEROL SULFATE HFA 108 (90 BASE) MCG/ACT IN AERS: INHALATION_SPRAY | RESPIRATORY_TRACT | 2 refills | Status: DC

## 2018-07-02 MED ORDER — METOPROLOL TARTRATE 25 MG PO TABS: 12.5000 mg | ORAL_TABLET | Freq: Two times a day (BID) | ORAL | 5 refills | Status: DC

## 2018-07-02 MED ORDER — CYCLOBENZAPRINE HCL 10 MG PO TABS: ORAL_TABLET | ORAL | 5 refills | Status: DC

## 2018-07-02 MED ORDER — PROMETHAZINE HCL 25 MG PO TABS: ORAL_TABLET | ORAL | 0 refills | Status: DC

## 2018-07-02 MED ORDER — AMITRIPTYLINE HCL 50 MG PO TABS: 50.0000 mg | ORAL_TABLET | Freq: Every day | ORAL | 1 refills | Status: DC

## 2018-07-02 MED ORDER — ESTRADIOL 0.5 MG PO TABS: 0.5000 mg | ORAL_TABLET | Freq: Every day | ORAL | 2 refills | Status: DC

## 2018-07-02 NOTE — Progress Notes (Signed)
Patient ID: Pamela Wall MRN: [id number], DOB: 1980-12-24, 37 y.o. Date of Encounter: @DATE @  Chief Complaint:  Chief Complaint  Patient presents with  . Hypertension  . medication refills    HPI: 37 y.o. year old white female  Presents with above.     THE FOLLOWING WAS DOCUMENTED AT HER OV NOTE WITH ME ON---- 04/12/2014:   She says that she was went to [facility] for a laparoscopic cholecystectomy. She was to have the procedure and go home the next day. However apparently once they got in, they found that her gallbladder was behind the liver and was adhered to the liver. They ended up doing an open cholecystectomy and patient says that they punctured her liver. Says that she was in [place] from 02/12/2014 - 02/19/14 and was transferred to [facility] from there. Was at [facility] from 4/17 through 4/24. She is scheduled for her next procedure 04/26/14--this is to place a stent in the bile duct. She also knows that she will have to have at least one other procedure to remove the current stent that she already has placed. As well she thinks that she has an incisional hernia. She and her grandmother report that the incision site looked like it does now as soon as they removed the staples -- this was done at [place] that she is seeing a Dr. Carlis Abbott, Dr. Amalia Hailey, Dr. Penelope Galas at [facility]. Also seeing a doctor [place]--pain management. Says that she had an MRI last week and has an appointment to see Dr. Carlis Abbott 6/22 prior to the stent procedure.  Says that Dr. Greta Doom at the pain clinic is prescribing the cyclobenzaprine and oxycodone.  Says one of the doctors at [facility] has been prescribing the Valium. However she has been unable to get in touch with him to get refills. Says the 10 mg is too strong and " knocks her out". Says a half of a pill (5 mg) works perfectly. As the prior to all of this she never had any problems with anxiety. Also never had problems with insomnia. Currently he  is prescribed amitriptyline for the insomnia. This is being prescribed by Dr. Greta Doom the pain clinic. Says prior to the amitriptyline she wasn't getting any sleep. With the amitriptyline, she is able to get 4 hours of sleep but that is all.  She says that prior to this she was very active and functional.  She has 2 children ages 43 and age  36.  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 She and her kids are staying with her grandmother who is with her at the visit today.   THE FOLLOWING IS FROM HER OV NOTE WITH ME ----05/20/2014:   Today she she reports that when she went for followup at [facility] that they were able to remove the stent and did not require any further surgeries. Also says that they felt the protrusion at the right upper quadrant may be secondary to layer of adipose tissue which was not pressed down firmly prior to the sutures being placed-- or  felt there could possibly be a small area of hernia -- plan to just monitor this for now. Patient states that they went down her throat to remove the stent. Says that she has had no further abdominal surgeries since her last visit with me.  However, for some reason, she developed swelling of both of her legs from July 13 -  July 15. She says that both her ankles and knees appeared swollen as big as her calves. However last night she elevated her legs and applied ice and this morning the swelling has resolved.  Also, she says that she is trying to wean down the diazepam but is wondering is she can get a refill. Says that she does still have times of feeling as if she is "shaking on the inside "and also having difficulty sleeping.   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  She had a hysterectomy secondary to cancer at age 49 months old. She says that she also had radiation therapy. It was later documented that the radiation had  "killed her ovaries". Says that she was on hormone replacement therapy to go through puberty. Later after that the hormone replacement therapy was used off and on. Says that when she was in her 39s she experienced hot flashes so they treated this with Premarin and Estratest. Says that they wanted to limit the amount of hormones that she was on so was eventually discontinued. Says that she went for years without any menopausal symptoms. However she says that she is now having significant sweats at night--even soaking her bed sheets--says that these are the exact same symptoms that she had had in the past. She recently called here about referral to GYN which I had approved. She is wanting to go ahead and get on some hormones to use while she waits for that appointment.  She also reports that her blood pressure has been a little bit high when she has gone to pain management recently. He says that back before this recent surgery her blood pressure used to be low in the 100s over 70s. Says that now it is about 145/100 every time she goes to pain management.  She is also requesting refill on Valium but wants to decrease the dose and just wants to have some available if needed.   ---05/17/2016: She has multiple issues to address at today's visit: --- Needs referral to a new GI doctor: Says that she needs a new GI doctor to help manage her current symptoms. Says that she is having episodes of it being as if her GI system gets totally stopped it and then suddenly goes. Is that during that time where it feels like everything stops she projectile vomits. An once things start to move again she has diarrhea. Says that these cycles were occurring every couple of months then every month and then every 2 weeks and are now getting more and more frequent. Says that she stopped gluten for a period of time hoping that would help. Kept a food diary. Nohing has helped and she continues with this cycle. Also needs a  refill on her Phenergan to use.  ----Needs referral to a new pain management.: Says that she was going to [facility] and most recently had been seeing Dr. Greta Doom. Says that her last office visit there she was informed that they no longer take Medicaid and can no longer prescribe her any further medications and provide any further care. Says that she is very upset because she has been seeing that same pain clinic for about 10 years and they know her very well. At 64 months of age she was found to have a very rare type of cancer which she says was managed at [place] that she had hysterectomy chemotherapy and radiation therapy  at that time. Because of the growth that occurred in her body after this treatment and the effects of this treatment, her body is not level and symmetrical and therefore has chronic pain in her back and abdomen.   She states that she still has one refill remaining on her Elavil and one refill remaining on her Flexeril. Does need prescription for oxycodone. She has her last bottle with her which reads take 1 pill 5 times a day and was for #150 and was dispensed on 04/20/16.  Today she tells me that she has been raising her sister's children. She has pictures of them on her phone that she shows me. She also tells me that she has gotten her "dream job and loves it " Is working at [employer]. However says that she needs to make sure that she keeps that job and does not lose that job because of these GI symptoms and also that it is embarrassing that she is having these GI symptoms. Says at the end of September she will be eligible to get on her company's insurance and may not have to have Medicaid after that point. However says that DSS informed her that because she has 3 dependents she may still requalify for Medicaid-- she is not sure. She is in very good spirits today.   10/24/2016: She states that recently for a period  of time she was without insurance. During that time her blood pressure medicine was not continued. Notes the blood pressure is reading high today off of medicines that she needs to get back on blood pressure medicine which was amlodipine 5 mg daily. She also states that she is having horrible hot flashes. Says that she was seeing the same GYN for 16 years and has called them to get appointment but it's going to take her 4 months before she can be seen there. Goes to [facility] and sees Elon Alas. Says the GYN will not refill her hormone therapy until she has office visit. Questing that I give her refills until she can follow-up with GYN. She has already picked up her pain medicines December 12. Is not out of these. No other concerns to address today.   01/28/2017: She reports that she has recently been having burning when she urinates, urinating very frequently and has also just started noticing that her urine looks bloody. She has had no fevers or chills and no back pain. She also reports that with her new insurance plan that the Premarin is a very high tier and high cost, but the Estrace is the low tier/ low cost preferred medicine. Requesting that I change this. Says that her co-pay to see GYN a $75 so she is waiting to get tax money back before she can schedule that visit. Says that she has been on this medication since she was 37 years old. She states that she already has prescription for pain medicine and is not due to get new prescription on that yet.  She states that she did resume her BP med. Is taking this daily. No adverse effects. No other concerns to address today.   06/13/2017: Today patient is in very good spirits. Says that things have been going very well at her job and she is really enjoying her job at [employer]. Names off multiple activities/special events they have planned. Also states that she and the children have moved into a new apartment that has more space. She  states that both kids  will be in middle school this year (biologic niece and nephew)--- one of them was already at the middle school but the other one is moving up to the middle school--- so she says this will all be an adjustment/new journey. Says that the kids are in [place] with her parents for 2 weeks right now so she has had 2 weeks to do what she wants/free from kids. She states that all of her medical issues have been stable and the been doing well. Her pain is controlled with current pain medications. Her hot flashes are controlled with current medications. She is taking blood pressure medication as directed and is having no lightheadedness or other adverse effects. She has no concerns to address today.  09/11/2017: Today Ms. Ohlendorf is doing very well and is in very good spirits.  She names off multiple events that she has coming up at her job at [employer].  She also states that the 2 kids are doing well in middle school.  Says that the older one actually will be starting high school in the next year or two.  Again today she reports that her medical issues have all been stable and well controlled. Her pain is controlled with current pain medications. Her hot flashes are controlled with current medications. She is taking blood pressure medication with no lightheadedness or other adverse effects. She has no specific concerns to address today.  12/16/2017: Ms. Waheed presents for routine follow-up visit.  She states that her stepdad and her mom live in [place].  States that her "stepdad has stage IV lung and brain cancer.  Says that they recently did chemotherapy but it was not effective and because of the location of the cancer they cannot do any surgery. " States that he is now on palliative treatment.  Says that he also has been diagnosed with flu and pneumonia.  States that her mom also is diagnosed with positive flu.  She is getting ready to fly there.  Is requesting prophylactic dose of  Tamiflu to take.  Also reports that the pharmacy where her last pain med was sent that the cost was a lot higher so I then offered to print her another prescription today.  I had then printed prescription for pain medicine as well as the Tamiflu and gave that to her then found out that she said she did go ahead and pay the increased cost and did fill the prior Rx from 12/11/17.  Meanwhile, I had already printed one Rx for her today that there was on the same Rx sheet as the Tamiflu that she would just keep that hard copy printed Rx to fill 01/08/18. Otherwise she states that things have been stable. Reports that the pain is continued to be controlled with current pain medication. Orts that the hot flashes are controlled with current medication. That she is taking blood pressure medication as directed and is having no adverse effect. No other specific concerns to address today.   07/02/2018:  Reports that the children who had been staying living with her are now back with their biologic mother.  States that the biologic mom now has a job Arboriculturist and has been documented to clean off of drugs.  States that the children are now ages 89 and 7.  Says that she is actually going to sublet her apartment and has found a place that she can stay in [place] that is just a few minutes from her work which will be much more  convenient.   She reports that she has been monitoring her heart rate and blood pressure. Reports that her heart rate is staying high.  States that it is at least 90 all the time even first thing in the morning. States that her blood pressure is 120/80 in the morning.  During the day sometimes it is 130s over 90 or at the highest 140/100. Is on Norvasc and was wondering if this medicine needs to be adjusted.   She states that she has actually recently started looking into some online dating.  Says that she has had the children with her for so long that this is all very new and different  for her.  Says that also to be honest she really "has zero sex drive" and has a lot of dryness everywhere which is concerning.  Is interested in trying bioidentical hormones but her GYN had told her she would need to see endocrinology so is wondering about referral to endocrinology.  She brings in her recent bill from pain management.  States that she has to go there for a visit every month and that her part of the payment (after insurance pays their part) for the visit is $262.  She also brings in a receipt from the pharmacy for her medicine.  States that she has to pay $93 for the medication itself every month. Is requesting that I prescribe her pain medication again.  Her co-pay to come here for a visit is much cheaper.  States that she cannot afford the payment for the pain clinic and the pain medication every month.  No other specific concerns to address today.   Past Medical History:  Diagnosis Date  . Asthma    childhood - no prob as adult  . Back pain   . 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 Depression    history - no meds  . Endodermal sinus tumor   . Enterocolitis   . Osteopenia   . Scoliosis   . 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 albuterol (VENTOLIN HFA) 108 (90 Base) MCG/ACT inhaler INHALE 2 PUFFS INTO THE LUNGS EVERY 4 TO 6 HOURS AS NEEDED FOR WHEEZING 18 g 0  . amitriptyline (ELAVIL) 50 MG tablet Take 1 tablet (50 mg total) by mouth at bedtime. 90 tablet 1  . cyclobenzaprine (FLEXERIL) 10 MG tablet TAKE (1) TABLET BY MOUTH THREE TIMES DAILY AS NEEDED. 60 tablet 5  . estradiol (ESTRACE) 0.5 MG tablet Take 1 tablet (0.5 mg total) by mouth daily. 90 tablet 2  . oxyCODONE (ROXICODONE) 15 MG immediate release tablet Take 1 tablet (15 mg total) by mouth every 4 (four) hours as needed. Must last 30 days. 150 tablet 0  . promethazine  (PHENERGAN) 25 MG suppository Place 1 suppository (25 mg total) every 6 (six) hours as needed rectally for nausea. 12 suppository 11  . promethazine (PHENERGAN) 25 MG tablet TAKE 1 TABLET BY MOUTH EVERY 8 HOURS AS NEEDED FOR NAUSEA AND VOMITING. 30 tablet 0  . amLODipine (NORVASC) 5 MG tablet Take 1 tablet (5 mg total) by mouth daily. 30 tablet 5  . oseltamivir (TAMIFLU) 75 MG capsule Take 1 daily for 10 days 10 capsule 0   No facility-administered medications prior to visit.     Allergies:  Allergies  Allergen Reactions  . Dog Epithelium Itching  Also Cat dander and Horse dander. Patient takes zyrtec as prophylactic.     Social History   Socioeconomic History  . Marital status: Single    Spouse name: Not on file  . Number of children: Not on file  . Years of education: Not on file  . Highest education level: Not on file  Occupational History  . Not on file  Social Needs  . Financial resource strain: Not on file  . Food insecurity:    Worry: Not on file    Inability: Not on file  . Transportation needs:    Medical: Not on file    Non-medical: Not on file  Tobacco Use  . Smoking status: Former Smoker    Packs/day: 0.25    Years: 10.00    Pack years: 2.50    Types: E-cigarettes    Last attempt to quit: 06/13/2014    Years since quitting: 4.0  . Smokeless tobacco: Never Used  . Tobacco comment: vapor cigs  Substance and Sexual Activity  . Alcohol use: Yes    Comment: socially  . Drug use: No  . Sexual activity: Yes    Birth control/protection: Surgical  Lifestyle  . Physical activity:    Days per week: Not on file    Minutes per session: Not on file  . Stress: Not on file  Relationships  . Social connections:    Talks on phone: Not on file    Gets together: Not on file    Attends religious service: Not on file    Active member of club or organization: Not on file    Attends meetings of clubs or organizations: Not on file    Relationship status: Not on file    . Intimate partner violence:    Fear of current or ex partner: Not on file    Emotionally abused: Not on file    Physically abused: Not on file    Forced sexual activity: Not on file  Other Topics Concern  . Not on file  Social History Narrative  . Not on file    Family History  Problem Relation Age of Onset  . Diabetes Father   . Breast cancer Paternal Grandmother   . Diabetes Paternal Grandmother      Review of Systems:  See HPI for pertinent ROS. All other ROS negative.    Physical Exam: Blood pressure 130/84, pulse 98, temperature 98.4 F (36.9 C), temperature source Oral, resp. rate 18, height 5\' 1"  (1.549 m), weight 52.7 kg, last menstrual period 08/15/1984, SpO2 99 %., Body mass index is 21.96 kg/m. General: WNWD WF. Appears in no acute distress. Neck: Supple. No thyromegaly. No lymphadenopathy. Lungs: Clear bilaterally to auscultation without wheezes, rales, or rhonchi. Breathing is unlabored. Heart: RRR with S1 S2. No murmurs, rubs, or gallops. Musculoskeletal:  Strength and tone normal for age. Extremities/Skin: Warm and dry. Neuro: Alert and oriented X 3. Moves all extremities spontaneously. Gait is normal. CNII-XII grossly in tact. Psych:  Responds to questions appropriately with a normal affect.     ASSESSMENT AND PLAN:  37 y.o. year old female with   Chronic pain syndrome 01/28/2017: Since she is leaving a urine sample today, I will run this for a urine drug screen to have this documentation. She already has her pain meds and is not due for prescription yet she will call and needs next Rx. - Drug Scr Ur, Pain Mgmt, Reflex Conf ---In the past--- she was having issues regarding getting off  work, getting here to pick up prescription, and getting prescription filled in timely manner ---At that time I told her that she can start calling me about a     week in advance and we can print prescription with note stating "do not fill until----" ---In addition to this  medication the pain clinic was also prescribing her Elavil and Flexeril.      I will cont to refill these.   ADDENDUM ADDED 06/25/2016: Received records from Pain Management Dr. Franco Collet OV note dated 03/22/2016. It is a normal note.  Makes no mention of patient misusing medications.  At that [facility] she was given Rx for oxycodone 15mg  -- take 1 tablet by mouth 5 times a day as needed for pain   # 150 + 0.  That note states "patient is instructed to follow-up in one month for pain reevaluation or sooner as needed"  Note dated 02/23/16 includes the following information --- "urine drug screen obtained at the last visit was consistent. No aberrent behavior noted"  At prior OV I placed a referral to pain clinic.  I will continue to prescribe her pain meds for now.  09/11/2017:  Drug Registry printed and reviewed.  She has received no other controlled substances.  I am the only prescriber.  Only one controlled substance.  Fill dates have been appropriate.  Recent fill dates were 06/15/17, 07/15/17, 08/14/17 Today I printed 3 Rxes for her pain medication "do not fill until 09/14/17, 1 for do not fill until 10/14/17, 1 for "do not fill until 11/14/2017."   12/16/2017:  I had sent a prescription electronically on 12/11/17. Today I printed a prescription which she will hold on to to fill 01/08/18. Today I discussed with her the new fingerprint system and to just make sure that we are aware of which pharmacy what is to go to in the future.  03/06/2018:  There are phone messages documented.  Referral placed for Pain Clinic  07/02/2018: I discussed that I had addressed all of her other issues but that absolutely cannot prescribe her narcotic medications any further. She starts to cry, saying that she cannot afford those payments. Says that she cannot work, cannot function without the pain medications because of her prior surgeries. Told her that I am sorry but that given the opioid crisis and the changes  with the opioids that I cannot go back to prescribing her opioids.  She must continue follow-up with pain clinic.   Essential hypertension 07/02/2018: Heart rate is 98 here today and she reports that it is always at least 90. Therefore will discontinue Norvasc and change to metoprolol 25 mg 1/2 tablet twice daily. Hopefully this will control her heart rate and her blood pressure. Monitors both heart rate and blood pressure at home.  She can monitor and will follow-up if needed.  Hot flashes 01/28/2017: Will change her HRT to Estrace. She will f/u with Gyn when she has the finances to do so - estradiol (ESTRACE) 0.5 MG tablet; Take 1 tablet (0.5 mg total) by mouth daily.  Dispense: 30 tablet; Refill: 11 12/16/2017--- hot flashes are stable. 07/02/2018: This is stable.  Continue current treatment.  History of uterine cancer History of hysterectomy 07/02/2018: She is interested in bioidentical hormones.  States that her gynecologist so that she would need to see endocrinologist for this.  Will refer to endocrinology for their input regarding this. - Ambulatory referral to Endocrinology   Routine office visit 3 months.    Ria Clock  [place], [place], [facility] 07/02/2018 5:23 PM

## 2018-09-01 ENCOUNTER — Other Ambulatory Visit: Payer: Self-pay | Admitting: Physician Assistant

## 2018-09-09 ENCOUNTER — Other Ambulatory Visit: Payer: Self-pay

## 2018-09-09 MED ORDER — AMITRIPTYLINE HCL 50 MG PO TABS: 50.0000 mg | ORAL_TABLET | Freq: Every day | ORAL | 1 refills | Status: DC

## 2018-09-12 ENCOUNTER — Other Ambulatory Visit: Payer: Self-pay | Admitting: Physician Assistant

## 2018-09-17 ENCOUNTER — Encounter: Payer: Self-pay | Admitting: Family Medicine

## 2018-09-17 ENCOUNTER — Other Ambulatory Visit: Payer: Self-pay

## 2018-09-17 ENCOUNTER — Ambulatory Visit (INDEPENDENT_AMBULATORY_CARE_PROVIDER_SITE_OTHER): Payer: 59 | Admitting: Family Medicine

## 2018-09-17 VITALS — BP 132/70 | HR 90 | Temp 98.6°F | Resp 14 | Ht 61.0 in | Wt 114.0 lb

## 2018-09-17 DIAGNOSIS — J4520 Mild intermittent asthma, uncomplicated: Secondary | ICD-10-CM

## 2018-09-17 DIAGNOSIS — E559 Vitamin D deficiency, unspecified: Secondary | ICD-10-CM | POA: Diagnosis not present

## 2018-09-17 DIAGNOSIS — Z90710 Acquired absence of both cervix and uterus: Secondary | ICD-10-CM

## 2018-09-17 DIAGNOSIS — R1115 Cyclical vomiting syndrome unrelated to migraine: Secondary | ICD-10-CM

## 2018-09-17 DIAGNOSIS — K529 Noninfective gastroenteritis and colitis, unspecified: Secondary | ICD-10-CM

## 2018-09-17 DIAGNOSIS — R232 Flushing: Secondary | ICD-10-CM | POA: Diagnosis not present

## 2018-09-17 DIAGNOSIS — I10 Essential (primary) hypertension: Secondary | ICD-10-CM

## 2018-09-17 DIAGNOSIS — R000 Tachycardia, unspecified: Secondary | ICD-10-CM

## 2018-09-17 DIAGNOSIS — M8580 Other specified disorders of bone density and structure, unspecified site: Secondary | ICD-10-CM

## 2018-09-17 DIAGNOSIS — Z8542 Personal history of malignant neoplasm of other parts of uterus: Secondary | ICD-10-CM

## 2018-09-17 MED ORDER — PROMETHAZINE HCL 25 MG PO TABS: ORAL_TABLET | ORAL | 2 refills | Status: DC

## 2018-09-17 MED ORDER — ALBUTEROL SULFATE HFA 108 (90 BASE) MCG/ACT IN AERS: 2.0000 | INHALATION_SPRAY | RESPIRATORY_TRACT | 2 refills | Status: DC | PRN

## 2018-09-17 MED ORDER — AMITRIPTYLINE HCL 50 MG PO TABS: 50.0000 mg | ORAL_TABLET | Freq: Every day | ORAL | 1 refills | Status: DC

## 2018-09-17 MED ORDER — METOPROLOL TARTRATE 25 MG PO TABS: 12.5000 mg | ORAL_TABLET | Freq: Two times a day (BID) | ORAL | 5 refills | Status: DC

## 2018-09-17 MED ORDER — CYCLOBENZAPRINE HCL 10 MG PO TABS: ORAL_TABLET | ORAL | 5 refills | Status: DC

## 2018-09-17 MED ORDER — ESTRADIOL 0.5 MG PO TABS: 0.5000 mg | ORAL_TABLET | Freq: Every day | ORAL | 2 refills | Status: DC

## 2018-09-17 MED ORDER — ESTRADIOL 0.5 MG PO TABS: 0.5000 mg | ORAL_TABLET | Freq: Every day | ORAL | 1 refills | Status: DC

## 2018-09-17 NOTE — Assessment & Plan Note (Signed)
PO phenergan Has phenergan suppository at home as well

## 2018-09-17 NOTE — Assessment & Plan Note (Signed)
BP well controlled, no SE on metoprolol Labs ordered Continue current meds F/up 6 months

## 2018-09-17 NOTE — Assessment & Plan Note (Signed)
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

## 2018-09-17 NOTE — Progress Notes (Signed)
Patient ID: Pamela Wall, female    DOB: 1981/07/02, 37 y.o.   MRN: [id number]  PCP: Orlena Sheldon, PA-C  Chief Complaint  Patient presents with  . Medication Refills    maintence meds    Subjective:   Pamela Wall is a 37 y.o. 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  Asthma -she had a history of asthma but has not used inhalers for several years until the last year and she has noted that her triggers are extremely hot or cold weather, she will feel wheezy and tight in her chest, will use the rescue inhaler and symptoms resolve.  In the last year she has not used oral steroids for any asthma exacerbation.  She does not have any seasonal allergies.  Chronic back pain and muscle spasms she uses Elavil, Flexeril and goes to pain management for narcotic pain medicine, she does need refills of Elavil and Flexeril.  She uses Phenergan intermittently for nausea, vomiting and diarrhea.  She states that she will have random waves of abdominal pain, nausea, vomiting and diarrhea that is related to past radiation.  She has not been using Phenergan suppositories very often and she has several at home.  She does request a refill of oral Phenergan.  Blood pressure is well maintained with change to metoprolol tartrate she takes 12.5 mg in the morning and at night.  Few months ago she had intolerance from side effects of amlodipine including severe bilateral lower extremity edema and tachycardia, she has done well with metoprolol, does not have any racing heart, denies any side effects of fatigue, malaise, exertional shortness of breath, chest pain, near-syncope.  She denies any palpitations or bradycardia.  She is going to pain management for chronic narcs.  Per chart review, last labs I can see that check renal function and cholesterol were in 2016.  Other labs for urine and UDS done  01/2017.   Patient Active Problem List   Diagnosis Date Noted  . History of uterine cancer 07/02/2018  . History of hysterectomy 07/02/2018  . Chronic pain syndrome 05/17/2016  . Adjustment disorder with anxiety 11/10/2014  . Insomnia 11/10/2014  . Hot flashes 11/10/2014  . HTN (hypertension) 11/10/2014  . Smoker 02/08/2014  . Scoliosis   . Enterocolitis   . Endodermal sinus tumor   . Osteopenia     Current Meds  Medication Sig  . albuterol (PROVENTIL HFA;VENTOLIN HFA) 108 (90 Base) MCG/ACT inhaler Inhale 2 puffs into the lungs every 4 (four) hours as needed for wheezing or shortness of breath.  Marland Kitchen amitriptyline (ELAVIL) 50 MG tablet Take 1 tablet (50 mg total) by mouth at bedtime.  . cyclobenzaprine (FLEXERIL) 10 MG tablet TAKE ONE TABLET BY MOUTH THREE TIMES DAILY AS NEEDED  . estradiol (ESTRACE) 0.5 MG tablet Take 1 tablet (0.5 mg total) by mouth daily.  . metoprolol tartrate (LOPRESSOR) 25 MG tablet Take 0.5 tablets (12.5 mg total) by mouth 2 (two) times daily.  Marland Kitchen oxyCODONE (ROXICODONE) 15 MG immediate release tablet Take 1 tablet (15 mg total) by mouth every 4 (four) hours as needed. Must last 30 days.  . promethazine (PHENERGAN) 25 MG tablet TAKE 1 TABLET BY MOUTH EVERY 8 HOURS AS NEEDED FOR NAUSEA AND VOMITING.  . [DISCONTINUED] albuterol (PROVENTIL HFA;VENTOLIN HFA) 108 (90 Base) MCG/ACT inhaler INHALE 2 PUFFS BY MOUTH EVERY 4 TO 6 HOURS AS NEEDED SHORTNESS OF BREATH  . [  DISCONTINUED] albuterol (PROVENTIL HFA;VENTOLIN HFA) 108 (90 Base) MCG/ACT inhaler Inhale 2 puffs into the lungs every 4 (four) hours as needed for wheezing or shortness of breath.  . [DISCONTINUED] amitriptyline (ELAVIL) 50 MG tablet Take 1 tablet (50 mg total) by mouth at bedtime.  . [DISCONTINUED] amitriptyline (ELAVIL) 50 MG tablet Take 1 tablet (50 mg total) by mouth at bedtime.  . [DISCONTINUED] cyclobenzaprine (FLEXERIL) 10 MG tablet TAKE ONE TABLET BY MOUTH THREE TIMES DAILY AS NEEDED  . [DISCONTINUED]  cyclobenzaprine (FLEXERIL) 10 MG tablet TAKE ONE TABLET BY MOUTH THREE TIMES DAILY AS NEEDED  . [DISCONTINUED] estradiol (ESTRACE) 0.5 MG tablet Take 1 tablet (0.5 mg total) by mouth daily.  . [DISCONTINUED] estradiol (ESTRACE) 0.5 MG tablet Take 1 tablet (0.5 mg total) by mouth daily.  . [DISCONTINUED] metoprolol tartrate (LOPRESSOR) 25 MG tablet Take 0.5 tablets (12.5 mg total) by mouth 2 (two) times daily.  . [DISCONTINUED] metoprolol tartrate (LOPRESSOR) 25 MG tablet Take 0.5 tablets (12.5 mg total) by mouth 2 (two) times daily.  . [DISCONTINUED] promethazine (PHENERGAN) 25 MG tablet TAKE 1 TABLET BY MOUTH EVERY 8 HOURS AS NEEDED FOR NAUSEA AND VOMITING.  . [DISCONTINUED] promethazine (PHENERGAN) 25 MG tablet TAKE 1 TABLET BY MOUTH EVERY 8 HOURS AS NEEDED FOR NAUSEA AND VOMITING.     Review of Systems     Objective:    Vitals:   09/17/18 1546  BP: 132/70  Pulse: 90  Resp: 14  Temp: 98.6 F (37 C)  TempSrc: Oral  SpO2: 98%  Weight: 114 lb (51.7 kg)  Height: 5\' 1"  (1.549 m)      Physical Exam  Constitutional: She is oriented to person, place, and time. She appears well-developed and well-nourished.  Non-toxic appearance. No distress.  HENT:  Head: Normocephalic and atraumatic.  Right Ear: External ear normal.  Left Ear: External ear normal.  Nose: Nose normal.  Mouth/Throat: Uvula is midline, oropharynx is clear and moist and mucous membranes are normal. No oropharyngeal exudate.  Eyes: Pupils are equal, round, and reactive to light. Conjunctivae, EOM and lids are normal.  Neck: Normal range of motion and phonation normal. Neck supple. No tracheal deviation present.  Cardiovascular: Normal rate, regular rhythm, normal heart sounds, intact distal pulses and normal pulses. Exam reveals no gallop and no friction rub.  No murmur heard. Pulses:      Radial pulses are 2+ on the right side, and 2+ on the left side.       Posterior tibial pulses are 2+ on the right side, and 2+  on the left side.  Pulmonary/Chest: Effort normal and breath sounds normal. No stridor. No respiratory distress. She has no wheezes. She has no rhonchi. She has no rales. She exhibits no tenderness.  Abdominal: Soft. Normal appearance and bowel sounds are normal. She exhibits no distension. There is no tenderness. There is no rebound and no guarding.  Musculoskeletal: Normal range of motion. She exhibits no edema or deformity.  Lymphadenopathy:    She has no cervical adenopathy.  Neurological: She is alert and oriented to person, place, and time. She exhibits normal muscle tone. Coordination and gait normal.  Skin: Skin is warm, dry and intact. Capillary refill takes less than 2 seconds. No rash noted. She is not diaphoretic. No pallor.  Psychiatric: She has a normal mood and affect. Her speech is normal and behavior is normal. Judgment and thought content normal.  Nursing note and vitals reviewed.         Assessment &  Plan:   37 y/o female presents for recheck and f/up on chronic conditions and med refills Problem List Items Addressed This Visit      Cardiovascular and Mediastinum   Hot flashes   Relevant Medications   estradiol (ESTRACE) 0.5 MG tablet   metoprolol tartrate (LOPRESSOR) 25 MG tablet   HTN (hypertension) - Primary    BP well controlled, no SE on metoprolol Labs ordered Continue current meds F/up 6 months      Relevant Medications   metoprolol tartrate (LOPRESSOR) 25 MG tablet   Other Relevant Orders   CBC with Differential/Platelet   Hemoglobin A1c   Lipid panel   TSH   Comprehensive metabolic panel     Digestive   Enterocolitis   Relevant Orders   CBC with Differential/Platelet   Comprehensive metabolic panel   Cyclical vomiting with nausea    PO phenergan Has phenergan suppository at home as well        Musculoskeletal and Integument   Osteopenia    CMP and vit D with past deficiency 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      Relevant Orders   Comprehensive metabolic panel   VITAMIN D 25 Hydroxy (Vit-D Deficiency, Fractures)     Other   History of uterine cancer   Relevant Orders   VITAMIN D 25 Hydroxy (Vit-D Deficiency, Fractures)   History of hysterectomy   Relevant Orders   VITAMIN D 25 Hydroxy (Vit-D Deficiency, Fractures)   Vitamin D deficiency   Relevant Orders   CBC with Differential/Platelet   VITAMIN D 25 Hydroxy (Vit-D Deficiency, Fractures)    Other Visit Diagnoses    Tachycardia       SE of amlodipine?  Continues after med d/c and has switched to BB, screen TSH and screen for anemia, may have nutritional deficiencies which could cause anemia and put her at risk for tachycardia.  No labs since 2016   Relevant Orders   CBC with Differential/Platelet   TSH   Comprehensive metabolic panel   Mild intermittent asthma without complication       avoid triggers - extreme hot and cold weather (avoid as able), use rescue SABA PRN, f/up if any worsening    Relevant Medications   albuterol (PROVENTIL HFA;VENTOLIN HFA) 108 (90 Base) MCG/ACT inhaler       order Future fasting labs [facility] and mail to pt to get done near by where she lives/works. All meds refilled with change in PCP At latest 6 month f/up    Delsa Grana, PA-C 09/17/18 6:17 PM

## 2018-09-25 ENCOUNTER — Other Ambulatory Visit: Payer: Self-pay | Admitting: Family Medicine

## 2018-11-28 ENCOUNTER — Other Ambulatory Visit: Payer: Self-pay | Admitting: Family Medicine

## 2018-12-17 ENCOUNTER — Other Ambulatory Visit: Payer: Self-pay

## 2019-01-01 ENCOUNTER — Ambulatory Visit: Payer: 59 | Admitting: Physician Assistant

## 2019-01-14 ENCOUNTER — Other Ambulatory Visit: Payer: Self-pay | Admitting: Family Medicine

## 2019-01-14 DIAGNOSIS — I10 Essential (primary) hypertension: Secondary | ICD-10-CM

## 2019-01-29 ENCOUNTER — Other Ambulatory Visit: Payer: Self-pay | Admitting: Family Medicine

## 2019-03-31 ENCOUNTER — Other Ambulatory Visit: Payer: Self-pay | Admitting: Family Medicine

## 2019-04-01 ENCOUNTER — Other Ambulatory Visit: Payer: Self-pay | Admitting: Family Medicine

## 2019-04-13 ENCOUNTER — Other Ambulatory Visit: Payer: Self-pay | Admitting: Family Medicine

## 2019-04-13 DIAGNOSIS — R232 Flushing: Secondary | ICD-10-CM

## 2019-04-15 ENCOUNTER — Other Ambulatory Visit: Payer: Self-pay | Admitting: Family Medicine

## 2019-04-15 DIAGNOSIS — I10 Essential (primary) hypertension: Secondary | ICD-10-CM

## 2019-04-21 ENCOUNTER — Ambulatory Visit: Payer: 59 | Admitting: Family Medicine

## 2019-04-22 ENCOUNTER — Ambulatory Visit: Payer: 59 | Admitting: Family Medicine

## 2019-04-24 ENCOUNTER — Other Ambulatory Visit: Payer: Self-pay

## 2019-04-27 ENCOUNTER — Encounter: Payer: 59 | Admitting: Women's Health

## 2019-04-28 ENCOUNTER — Encounter: Payer: Self-pay | Admitting: Women's Health

## 2019-04-28 ENCOUNTER — Ambulatory Visit (INDEPENDENT_AMBULATORY_CARE_PROVIDER_SITE_OTHER): Payer: 59 | Admitting: Women's Health

## 2019-04-28 ENCOUNTER — Ambulatory Visit: Payer: 59 | Admitting: Family Medicine

## 2019-04-28 ENCOUNTER — Other Ambulatory Visit: Payer: Self-pay

## 2019-04-28 VITALS — BP 122/90 | Ht 61.0 in | Wt 118.0 lb

## 2019-04-28 DIAGNOSIS — Z01419 Encounter for gynecological examination (general) (routine) without abnormal findings: Secondary | ICD-10-CM | POA: Diagnosis not present

## 2019-04-28 DIAGNOSIS — Z13820 Encounter for screening for osteoporosis: Secondary | ICD-10-CM | POA: Diagnosis not present

## 2019-04-28 MED ORDER — FLUCONAZOLE 100 MG PO TABS: ORAL_TABLET | ORAL | 0 refills | Status: DC

## 2019-04-28 MED ORDER — EST ESTROGENS-METHYLTEST 1.25-2.5 MG PO TABS: 1.0000 | ORAL_TABLET | Freq: Every day | ORAL | 4 refills | Status: DC

## 2019-04-28 MED ORDER — PREMARIN 0.625 MG/GM VA CREA: TOPICAL_CREAM | VAGINAL | 12 refills | Status: AC

## 2019-04-28 NOTE — Progress Notes (Signed)
Pamela Wall [birth year] [id number]    History:    Presents for annual exam.  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 Primary care managing hypertension, osteoporosis, anxiety/depression.  Last here 2016.  Has not been sexually active since, denies need for STD screen.  Currently in a new relationship, he has had a negative STD screen.  Past medical history, past surgical history, family history and social history were all reviewed and documented in the EPIC chart.  Was working as a Audiological scientist, had cholecystectomy 2015 and numerous complications after the surgery due to adhesions.  Currently working as an Forensic scientist.  Is in a new relationship but has not been sexually active due to vaginal atrophy.  Had custody of sisters 3 children who had been in prison due to drug abuse for 5 years and the children have recently returned to their biologic mother against her wishes.  ROS:  A ROS was performed and pertinent positives and negatives are included.  Exam:  Vitals:   04/28/19 1435  BP: 122/90  Weight: 118 lb (53.5 kg)  Height: 5\' 1"  (1.549 m)   Body mass index is 22.3 kg/m.   General appearance:  Normal/scoliosis Thyroid:  Symmetrical, normal in size, without palpable masses or nodularity. Respiratory  Auscultation:  Clear without wheezing or rhonchi Cardiovascular  Auscultation:  Regular rate, without rubs, murmurs or gallops  Edema/varicosities:  Not grossly evident Abdominal  Soft,nontender, without masses, guarding or rebound.  Liver/spleen:  No organomegaly noted  Hernia:  None appreciated  Skin  Inspection:  Grossly normal   Breasts: Examined lying and sitting.     Right: Without masses, retractions, discharge or axillary adenopathy.     Left: Without masses, retractions, discharge or axillary adenopathy. Gentitourinary   Inguinal/mons:  Normal without inguinal adenopathy  External genitalia:  Normal  BUS/Urethra/Skene's glands:  Normal  Vagina:  Severe atrophy   Cervix: And uterus absent  Adnexa/parametria:     Rt: Without masses or tenderness.   Lt: Without masses or tenderness.  Anus and perineum: Normal  Digital rectal exam: Normal sphincter tone without palpated masses or tenderness  Assessment/Plan:  38 y.o. S W[place] G0 for annual exam.    TAH with BSO for intradermal tumor age 60 months Severe vaginal atrophy Hypertension, anxiety/depression-primary care manages labs and meds  Plan: Options reviewed, Premarin vaginal cream 1 applicator twice weekly, encouraged vaginal injectable type lubricant such as refresh.  Estratest prescription given, had been on compounded estrogen and testosterone years ago with good relief of menopausal symptoms, currently on estradiol 0.5 with numerous hot flushes and poor sleep.  Instructed to stop the estradiol and call if no relief with the Estratest.  SBEs, annual screening mammogram at 42, calcium rich foods, vitamin D 2000 daily encouraged.  Repeat DEXA.  Encouraged counseling.  Pap from vaginal cuff.    [place], 3:04 PM 04/28/2019

## 2019-04-28 NOTE — Patient Instructions (Signed)

## 2019-04-30 ENCOUNTER — Telehealth: Payer: Self-pay

## 2019-04-30 LAB — PAP IG W/ RFLX HPV ASCU

## 2019-04-30 NOTE — Telephone Encounter (Signed)
That is good, good idea

## 2019-04-30 NOTE — Telephone Encounter (Signed)
Patient was informed. Patient said since it has been so long since she has had a Pap smear that she would like to try again in a few months. She said you prescribed estrogen vag cream at her visit and she thinks that might help. appt was scheduled for 06/22/19 to give her time to use the estrogen cream.

## 2019-04-30 NOTE — Telephone Encounter (Signed)
-----   Message from Huel Cote, NP sent at 04/30/2019 10:52 AM EDT ----- Please call and review pap not done, not enough cells due to dryness, hx of TAH BSO at 18 mo for Ca tumor,  Good that no bad cells were noted. Will repeat in a year

## 2019-05-01 ENCOUNTER — Other Ambulatory Visit: Payer: Self-pay | Admitting: Family Medicine

## 2019-05-05 ENCOUNTER — Ambulatory Visit: Payer: 59 | Admitting: Family Medicine

## 2019-05-12 ENCOUNTER — Ambulatory Visit: Payer: Self-pay | Admitting: Family Medicine

## 2019-05-19 ENCOUNTER — Ambulatory Visit: Payer: 59 | Admitting: Family Medicine

## 2019-05-22 ENCOUNTER — Other Ambulatory Visit: Payer: Self-pay | Admitting: Family Medicine

## 2019-05-22 DIAGNOSIS — I10 Essential (primary) hypertension: Secondary | ICD-10-CM

## 2019-05-26 ENCOUNTER — Ambulatory Visit: Payer: 59 | Admitting: Family Medicine

## 2019-06-02 ENCOUNTER — Ambulatory Visit: Payer: 59 | Admitting: Family Medicine

## 2019-06-03 ENCOUNTER — Other Ambulatory Visit: Payer: Self-pay | Admitting: Family Medicine

## 2019-06-03 DIAGNOSIS — I10 Essential (primary) hypertension: Secondary | ICD-10-CM

## 2019-06-19 ENCOUNTER — Other Ambulatory Visit: Payer: Self-pay

## 2019-06-22 ENCOUNTER — Ambulatory Visit: Payer: 59 | Admitting: Women's Health

## 2019-07-06 ENCOUNTER — Other Ambulatory Visit: Payer: Self-pay | Admitting: Family Medicine

## 2019-07-10 ENCOUNTER — Other Ambulatory Visit: Payer: Self-pay | Admitting: Family Medicine

## 2019-07-10 DIAGNOSIS — R232 Flushing: Secondary | ICD-10-CM

## 2019-07-14 ENCOUNTER — Ambulatory Visit: Payer: 59 | Admitting: Women's Health

## 2019-07-31 ENCOUNTER — Other Ambulatory Visit: Payer: Self-pay

## 2019-08-03 ENCOUNTER — Other Ambulatory Visit: Payer: Self-pay

## 2019-08-03 ENCOUNTER — Ambulatory Visit (INDEPENDENT_AMBULATORY_CARE_PROVIDER_SITE_OTHER): Payer: 59 | Admitting: Women's Health

## 2019-08-03 ENCOUNTER — Ambulatory Visit: Payer: 59 | Admitting: Family Medicine

## 2019-08-03 ENCOUNTER — Encounter: Payer: Self-pay | Admitting: Women's Health

## 2019-08-03 ENCOUNTER — Other Ambulatory Visit: Payer: Self-pay | Admitting: Family Medicine

## 2019-08-03 ENCOUNTER — Encounter: Payer: Self-pay | Admitting: Family Medicine

## 2019-08-03 VITALS — BP 122/86 | HR 90 | Temp 98.6°F | Resp 14 | Ht 61.0 in | Wt 121.0 lb

## 2019-08-03 DIAGNOSIS — R102 Pelvic and perineal pain unspecified side: Secondary | ICD-10-CM

## 2019-08-03 DIAGNOSIS — N952 Postmenopausal atrophic vaginitis: Secondary | ICD-10-CM | POA: Diagnosis not present

## 2019-08-03 DIAGNOSIS — R232 Flushing: Secondary | ICD-10-CM

## 2019-08-03 DIAGNOSIS — G8929 Other chronic pain: Secondary | ICD-10-CM

## 2019-08-03 DIAGNOSIS — Z8542 Personal history of malignant neoplasm of other parts of uterus: Secondary | ICD-10-CM

## 2019-08-03 DIAGNOSIS — I10 Essential (primary) hypertension: Secondary | ICD-10-CM | POA: Diagnosis not present

## 2019-08-03 DIAGNOSIS — K529 Noninfective gastroenteritis and colitis, unspecified: Secondary | ICD-10-CM

## 2019-08-03 DIAGNOSIS — F902 Attention-deficit hyperactivity disorder, combined type: Secondary | ICD-10-CM

## 2019-08-03 MED ORDER — LISDEXAMFETAMINE DIMESYLATE 30 MG PO CAPS: 30.0000 mg | ORAL_CAPSULE | Freq: Every day | ORAL | 0 refills | Status: DC

## 2019-08-03 MED ORDER — PROMETHAZINE HCL 25 MG PO TABS: ORAL_TABLET | ORAL | 2 refills | Status: DC

## 2019-08-03 MED ORDER — ESTRING 2 MG VA RING: 2.0000 mg | VAGINAL_RING | VAGINAL | 3 refills | Status: DC

## 2019-08-03 NOTE — Patient Instructions (Signed)
Atrophic Vaginitis Atrophic vaginitis is a condition in which the tissues that line the vagina become dry and thin. This condition occurs in women who have stopped having their period. It is caused by a drop in a female hormone (estrogen). This hormone helps:  To keep the vagina moist.  To make a clear fluid. This clear fluid helps: ? To make the vagina ready for sex. ? To protect the vagina from infection. If the lining of the vagina is dry and thin, it may cause irritation, burning, or itchiness. It may also:  Make sex painful.  Make an exam of your vagina painful.  Cause bleeding.  Make you lose interest in sex.  Cause a burning feeling when you pee (urinate).  Cause a brown or yellow fluid to come from your vagina. Some women do not have symptoms. Follow these instructions at home: Medicines  Take over-the-counter and prescription medicines only as told by your doctor.  Do not use herbs or other medicines unless your doctor says it is okay.  Use medicines for for dryness. These include: ? Oils to make the vagina soft. ? Creams. ? Moisturizers. General instructions  Do not douche.  Do not use products that can make your vagina dry. These include: ? Scented sprays. ? Scented tampons. ? Scented soaps.  Sex can help increase blood flow and soften the tissue in the vagina. If it hurts to have sex: ? Tell your partner. ? Use products to make sex more comfortable. Use these only as told by your doctor. Contact a doctor if you:  Have discharge from the vagina that is different than usual.  Have a bad smell coming from your vagina.  Have new symptoms.  Do not get better.  Get worse. Summary  Atrophic vaginitis is a condition in which the lining of the vagina becomes dry and thin.  This condition affects women who have stopped having their periods.  Treatment may include using products that help make the vagina soft.  Call a doctor if do not get better with  treatment. This information is not intended to replace advice given to you by your health care provider. Make sure you discuss any questions you have with your health care provider. Document Released: 04/09/2008 Document Revised: 11/04/2017 Document Reviewed: 11/04/2017 [employer] Patient Education  2020 [employer]  

## 2019-08-03 NOTE — Progress Notes (Signed)
Subjective:    Patient ID: Pamela Wall, female    DOB: 1981-06-20, 38 y.o.   MRN: [id number]  HPI Patient is here today to establish care with me.  She is previously seen my partner who has retired.  Patient is currently going to a pain clinic.  She receives oxycodone 15 mg 5 times a day.  She states that she has been on this dose for the last 10 to 12 years.  She goes to a pain clinic to receive this medication although she is interested in having [place] fill the prescription for cost reasons.  Patient states that she was born with a rare malignancy and before age 43 she had undergone a complete and total abdominal hysterectomy.  She also underwent radiation therapy and chemotherapy which caused severe nerve damage and abdominal pain and adhesions.  As a result, she suffered severe intestinal damage.  Ultimately she underwent several exploratory surgeries due to her abdominal pain.  She had her gallbladder removed.  Apparently there was postoperative complications including a leaking bile duct and pancreatitis which required repeat surgery to remove scar tissue and adhesions.  She states that she has chronic severe enterocolitis.  She states that she has diarrhea 17 times per day.  She denies any melena or hematochezia.  She is seeing gastroenterology.  Colonoscopies have shown only scar tissue but no evidence of inflammatory bowel disease.  Currently she manages her diarrhea with Imodium and Lomotil as needed even though she is on oxycodone 15 mg 5 times a day.  She also reports occasional nausea which she has to take Phenergan for.  She denies any fevers or chills or melena or hematochezia.  She sees gynecology who performs her Pap smears and pelvic exams and also breast cancer screening.    Patient states that her family history is concerning for ADD.  Both her mother and her sister have been diagnosed.  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  Recently she is in jeopardy of  losing her job due to chronically and recurrently being late.  She also reports symptoms concerning for ADHD.  Patient completed a self questionnaire.  Patient states that she struggles to maintain focus.  She states that she has to reread the same reports at least 5 times before she is able to process the information and to check for errors and to make sure that she has not left out anything.  Her job requires ongoing research and data analysis and she has a difficult time focusing on this and she frequently loses her place.  Patient states that she has weekly and monthly meetings with her superiors.  She has a difficult time staying focused in the meetings and often finds herself daydreaming and losing her focus.  They become frustrated with her and she is received warnings at her employment.  She states that the worst symptom is her inability to follow through on task.  She states that her company she often has 20+ projects going simultaneously.  She organizes and leads a team that manages this but she often quickly loses focus and fails to complete tasks and assignments in a timely manner.  She finds it difficult to complete task that require sustained mental effort.  She also reports frequently losing items such as her keys, her purse, and her phone.  She is easily distracted by external stimuli.  She also reports problems with hyperactivity and impulsivity.  She is constantly tapping her feet tapping  pins on her desk and fidgeting at work.  She has a difficult time staying seated for prolonged periods of time and frequently has to leave her seat at work.  She feels constantly restless and finds it impossible just to sit quietly and relax.  She states that she feels like she is on the go all the time.  She states that others find it difficult to keep up with her during dinners or during meetings.  She denies any issues with excessive talking.  However she does report problems blurting out answers which  frequently embarrasses her and makes her feel like she is being disrespectful.  She reports a very difficult time waiting her turn she also reports butting in the conversation. Past Medical History:  Diagnosis Date  . Asthma    childhood - no prob as adult  . Back pain   . Cancer [facility])    endometrial  sinus tumor  . Depression    history - no meds  . Endodermal sinus tumor   . Enterocolitis   . Osteopenia   . Scoliosis   . 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 ABDOMINAL HYSTERECTOMY     TAH BSO-vaginectomy  . CHOLECYSTECTOMY OPEN  02/12/2014   T Tube with bile drain  . DIAGNOSTIC LAPAROSCOPY    . ERCP N/A 02/18/2014   Procedure: UNSUCCESSFUL ENDOSCOPIC RETROGRADE CHOLANGIOPANCREATOGRAPHY (ERCP);  Surgeon: Rogene Houston, MD;  Location: [facility];  Service: Gastroenterology;  Laterality: N/A;  . PELVIC LAPAROSCOPY  2009   Expl. Lap  . WISDOM TOOTH EXTRACTION     Current Outpatient Medications on File Prior to Visit  Medication Sig Dispense Refill  . albuterol (VENTOLIN HFA) 108 (90 Base) MCG/ACT inhaler INHALE 2 PUFFS BY MOUTH EVERY 4 TO 6 HOURS AS NEEDED FOR SHORTNESS OF BREATH 18 g 3  . conjugated estrogens (PREMARIN) vaginal cream Use twice weekly vaginally 42.5 g 12  . cyclobenzaprine (FLEXERIL) 10 MG tablet TAKE ONE TABLET BY MOUTH THREE TIMES DAILY AS NEEDED 60 tablet 5  . estrogens-methylTEST (ESTRATEST) 1.25-2.5 MG tablet Take 1 tablet by mouth daily. 90 tablet 4  . fluconazole (DIFLUCAN) 100 MG tablet Take 2 tablets weekly as needed 15 tablet 0  . metoprolol tartrate (LOPRESSOR) 25 MG tablet TAKE 1/2 TABLET BY MOUTH TWICE DAILY 15 tablet 0  . oxyCODONE (ROXICODONE) 15 MG immediate release tablet Take 1 tablet (15 mg total) by mouth every 4 (four) hours as needed. Must last 30 days. 150 tablet 0  . promethazine (PHENERGAN) 25 MG tablet TAKE 1 TABLET BY MOUTH EVERY 8 HOURS AS NEEDED  FOR NAUSEA AND VOMITING. 30 tablet 2  . promethazine (PHENERGAN) 25 MG suppository Place 1 suppository (25 mg total) every 6 (six) hours as needed rectally for nausea. 12 suppository 11  . [DISCONTINUED] estradiol (ESTRACE) 0.5 MG tablet TAKE 1 TABLET BY MOUTH EVERY DAY 90 tablet 0   No current facility-administered medications on file prior to visit.    Allergies  Allergen Reactions  . Dog Epithelium Itching    Also Cat dander and Horse dander. Patient takes zyrtec as prophylactic.   . Influenza Vaccines     Allergy to binding protein   Social History   Socioeconomic History  . Marital status: Single    Spouse name: Not on file  . Number of children: Not on file  . Years of education: Not on file  .  Highest education level: Not on file  Occupational History  . Not on file  Social Needs  . Financial resource strain: Not on file  . Food insecurity    Worry: Not on file    Inability: Not on file  . Transportation needs    Medical: Not on file    Non-medical: Not on file  Tobacco Use  . Smoking status: Former Smoker    Packs/day: 0.25    Years: 10.00    Pack years: 2.50    Types: E-cigarettes    Quit date: 06/13/2014    Years since quitting: 5.1  . Smokeless tobacco: Never Used  . Tobacco comment: vapor cigs  Substance and Sexual Activity  . Alcohol use: Yes    Comment: socially  . Drug use: No  . Sexual activity: Yes    Birth control/protection: Surgical    Comment: sexual hx question not answered  Lifestyle  . Physical activity    Days per week: Not on file    Minutes per session: Not on file  . Stress: Not on file  Relationships  . Social Herbalist on phone: Not on file    Gets together: Not on file    Attends religious service: Not on file    Active member of club or organization: Not on file    Attends meetings of clubs or organizations: Not on file    Relationship status: Not on file  . Intimate partner violence    Fear of current or ex  partner: Not on file    Emotionally abused: Not on file    Physically abused: Not on file    Forced sexual activity: Not on file  Other Topics Concern  . Not on file  Social History Narrative  . Not on file      Review of Systems  All other systems reviewed and are negative.      Objective:   Physical Exam Vitals signs reviewed.  Constitutional:      General: She is not in acute distress.    Appearance: Normal appearance. She is normal weight. She is not ill-appearing or toxic-appearing.  Cardiovascular:     Rate and Rhythm: Normal rate and regular rhythm.     Pulses: Normal pulses.     Heart sounds: Normal heart sounds. No murmur. No friction rub. No gallop.   Pulmonary:     Effort: Pulmonary effort is normal. No respiratory distress.     Breath sounds: Normal breath sounds. No stridor. No wheezing, rhonchi or rales.  Chest:     Chest wall: No tenderness.  Abdominal:     General: Abdomen is flat. Bowel sounds are normal. There is no distension.     Palpations: Abdomen is soft.     Tenderness: There is abdominal tenderness. There is guarding. There is no rebound.    Neurological:     Mental Status: She is alert.  Psychiatric:        Mood and Affect: Mood normal.        Behavior: Behavior normal.        Thought Content: Thought content normal.        Judgment: Judgment normal.           Assessment & Plan:  Chronic pelvic pain in female - Plan: Drug Screen, Urine  Essential hypertension  Enterocolitis  History of uterine cancer  I states that I would be willing to refill her oxycodone 15 mg 5 times a  day which would equate to 150 tablets/month.  However in order to do this I would require a urine drug screen every 6 months.  I would also need to review the records from her most recent pain clinic to rule out any history of abuse or diversion which the patient denies today but would be perfectly willing to get me her records for me to review.  Her past  medical history is certainly complicated and her chronic pain seems legitimate and reasonable in the way that it is managed.  Her blood pressures well controlled today.  Unfortunately she has severe enterocolitis which is currently being managed suboptimally with Imodium and Lomotil.  She is not a candidate for Viberzi due to her cholecystectomy.  She also has symptoms of ADHD and would meet DSM-IV criteria for this diagnosis.  She is in jeopardy of losing her job.  Therefore I have recommended trying Vyvanse 30 mg a day and reassessing at an office visit in 1 month to recheck her blood pressure and monitor her weight on the medication to ensure an effective dose.

## 2019-08-03 NOTE — Progress Notes (Signed)
38 year old S WF G0 presents to discuss vaginal atrophy/dryness/inability to have intercourse due to vaginal burning and discomfort.  Annual exam 04/2019 had not been sexually active in many years and in a new relationship desiring to have intercourse.  Has been using at home dilators, vaginal estrogen cream and Estratest 1.25/2.5 mg daily since 04/28/2019 with some relief but has still not been able to have intercourse.  Reports relationship is  good with understanding and intimacy.   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  Denies urinary symptoms, vaginal discharge, abdominal pain or fever.  Primary care manages hypertension, osteoporosis and depression.  Has had a stressful few years, had full custody of sisters 3 children who had history of drug abuse and sister now has taken back custody and struggling and recently allowed visitation.  Had youngest niece since 55 weeks of age until age 32.  Exam: Appears well.  No CVAT.  Abdomen soft, nontender, external genitalia atrophic, able to insert Peterson speculum but unable to open and was able to tolerate.  No visible discharge wet prep negative.  Able to relax and contract vagina on command.  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  Other options reviewed we will continue vaginal dilators, vaginal introitus massage.  Options of Estring discussed would like to try Estring 2 mg per vagina q. 3 months with a coupon prescription given with instructions.  Will call if any problems.  We will continue small amount of vaginal estrogen cream at introitus and vaginal lubricants with intercourse.  Instructed to call if no relief or problems with Estring or continued problems with intercourse.

## 2019-08-04 LAB — WET PREP FOR TRICH, YEAST, CLUE

## 2019-08-04 NOTE — Addendum Note (Signed)
Addended by: Huel Cote on: 08/04/2019 10:05 AM   Modules accepted: Orders

## 2019-08-05 ENCOUNTER — Other Ambulatory Visit: Payer: Self-pay | Admitting: Women's Health

## 2019-08-05 ENCOUNTER — Other Ambulatory Visit: Payer: Self-pay | Admitting: Family Medicine

## 2019-08-05 ENCOUNTER — Telehealth: Payer: Self-pay | Admitting: Family Medicine

## 2019-08-05 NOTE — Telephone Encounter (Signed)
Pt will need PA for Vyvanse - Her ins will cover Adderall XR - ? appropriate to change or do PA?

## 2019-08-06 ENCOUNTER — Other Ambulatory Visit: Payer: Self-pay | Admitting: Family Medicine

## 2019-08-06 MED ORDER — AMPHETAMINE-DEXTROAMPHET ER 20 MG PO CP24: 20.0000 mg | ORAL_CAPSULE | ORAL | 0 refills | Status: DC

## 2019-08-06 NOTE — Telephone Encounter (Signed)
I will change to Adderall 20 mg a day

## 2019-08-10 ENCOUNTER — Other Ambulatory Visit: Payer: Self-pay

## 2019-08-10 ENCOUNTER — Telehealth: Payer: Self-pay | Admitting: Family Medicine

## 2019-08-10 ENCOUNTER — Other Ambulatory Visit: Payer: 59

## 2019-08-10 DIAGNOSIS — R52 Pain, unspecified: Secondary | ICD-10-CM

## 2019-08-10 NOTE — Telephone Encounter (Signed)
Patient came in today for a drug screening. She had a question in reference to the change of medication she states that Vyvanse 30 mg was called in originally however her insurance wouldn't cover it so adderall 20 mg was called in. She wants to verify that was the correct mg.  CB# [id number]

## 2019-08-12 LAB — DRUGS OF ABUSE SCREEN W/O ALC, ROUTINE URINE
AMPHETAMINE: POSITIVE — AB
AMPHETAMINES (1000 ng/mL SCRN): POSITIVE — AB
BARBITURATES: NEGATIVE
BENZODIAZEPINES: NEGATIVE
COCAINE METABOLITES: NEGATIVE
MARIJUANA MET (50 ng/mL SCRN): NEGATIVE
METHADONE: NEGATIVE
METHAQUALONE: NEGATIVE
OPIATES: NEGATIVE
PHENCYCLIDINE: NEGATIVE
PROPOXYPHENE: NEGATIVE

## 2019-08-13 NOTE — Telephone Encounter (Signed)
LMTRC

## 2019-08-14 NOTE — Telephone Encounter (Signed)
See Lab note

## 2019-08-18 ENCOUNTER — Other Ambulatory Visit: Payer: Self-pay | Admitting: Family Medicine

## 2019-08-18 DIAGNOSIS — I10 Essential (primary) hypertension: Secondary | ICD-10-CM

## 2019-08-28 ENCOUNTER — Ambulatory Visit: Payer: 59 | Admitting: Family Medicine

## 2019-08-31 ENCOUNTER — Ambulatory Visit: Payer: 59 | Admitting: Family Medicine

## 2019-08-31 ENCOUNTER — Encounter: Payer: Self-pay | Admitting: Family Medicine

## 2019-08-31 ENCOUNTER — Other Ambulatory Visit: Payer: Self-pay

## 2019-08-31 VITALS — BP 120/76 | HR 88 | Temp 97.6°F | Resp 12 | Ht 61.0 in | Wt 123.0 lb

## 2019-08-31 DIAGNOSIS — F902 Attention-deficit hyperactivity disorder, combined type: Secondary | ICD-10-CM | POA: Diagnosis not present

## 2019-08-31 MED ORDER — AMPHETAMINE-DEXTROAMPHETAMINE 15 MG PO TABS: 15.0000 mg | ORAL_TABLET | Freq: Two times a day (BID) | ORAL | 0 refills | Status: DC

## 2019-08-31 NOTE — Progress Notes (Signed)
Subjective:    Patient ID: Pamela Wall, female    DOB: [birth year], 38 y.o.   MRN: [id number]  HPI  08/03/19 Patient is here today to establish care with me.  She is previously seen my partner who has retired.  Patient is currently going to a pain clinic.  She receives oxycodone 15 mg 5 times a day.  She states that she has been on this dose for the last 10 to 12 years.  She goes to a pain clinic to receive this medication although she is interested in having [place] fill the prescription for cost reasons.  Patient states that she was born with a rare malignancy and before age 49 she had undergone a complete and total abdominal hysterectomy.  She also underwent radiation therapy and chemotherapy which caused severe nerve damage and abdominal pain and adhesions.  As a result, she suffered severe intestinal damage.  Ultimately she underwent several exploratory surgeries due to her abdominal pain.  She had her gallbladder removed.  Apparently there was postoperative complications including a leaking bile duct and pancreatitis which required repeat surgery to remove scar tissue and adhesions.  She states that she has chronic severe enterocolitis.  She states that she has diarrhea 17 times per day.  She denies any melena or hematochezia.  She is seeing gastroenterology.  Colonoscopies have shown only scar tissue but no evidence of inflammatory bowel disease.  Currently she manages her diarrhea with Imodium and Lomotil as needed even though she is on oxycodone 15 mg 5 times a day.  She also reports occasional nausea which she has to take Phenergan for.  She denies any fevers or chills or melena or hematochezia.  She sees gynecology who performs her Pap smears and pelvic exams and also breast cancer screening.    Patient states that her family history is concerning for ADD.  Both her mother and her sister have been diagnosed.  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  Recently she is in  jeopardy of losing her job due to chronically and recurrently being late.  She also reports symptoms concerning for ADHD.  Patient completed a self questionnaire.  Patient states that she struggles to maintain focus.  She states that she has to reread the same reports at least 5 times before she is able to process the information and to check for errors and to make sure that she has not left out anything.  Her job requires ongoing research and data analysis and she has a difficult time focusing on this and she frequently loses her place.  Patient states that she has weekly and monthly meetings with her superiors.  She has a difficult time staying focused in the meetings and often finds herself daydreaming and losing her focus.  They become frustrated with her and she is received warnings at her employment.  She states that the worst symptom is her inability to follow through on task.  She states that her company she often has 20+ projects going simultaneously.  She organizes and leads a team that manages this but she often quickly loses focus and fails to complete tasks and assignments in a timely manner.  She finds it difficult to complete task that require sustained mental effort.  She also reports frequently losing items such as her keys, her purse, and her phone.  She is easily distracted by external stimuli.  She also reports problems with hyperactivity and impulsivity.  She is constantly tapping her  feet tapping pins on her desk and fidgeting at work.  She has a difficult time staying seated for prolonged periods of time and frequently has to leave her seat at work.  She feels constantly restless and finds it impossible just to sit quietly and relax.  She states that she feels like she is on the go all the time.  She states that others find it difficult to keep up with her during dinners or during meetings.  She denies any issues with excessive talking.  However she does report problems blurting out answers  which frequently embarrasses her and makes her feel like she is being disrespectful.  She reports a very difficult time waiting her turn she also reports butting in the conversation.  At that time, my plan was: I states that I would be willing to refill her oxycodone 15 mg 5 times a day which would equate to 150 tablets/month.  However in order to do this I would require a urine drug screen every 6 months.  I would also need to review the records from her most recent pain clinic to rule out any history of abuse or diversion which the patient denies today but would be perfectly willing to get me her records for me to review.  Her past medical history is certainly complicated and her chronic pain seems legitimate and reasonable in the way that it is managed.  Her blood pressures well controlled today.  Unfortunately she has severe enterocolitis which is currently being managed suboptimally with Imodium and Lomotil.  She is not a candidate for Viberzi due to her cholecystectomy.  She also has symptoms of ADHD and would meet DSM-IV criteria for this diagnosis.  She is in jeopardy of losing her job.  Therefore I have recommended trying Vyvanse 30 mg a day and reassessing at an office visit in 1 month to recheck her blood pressure and monitor her weight on the medication to ensure an effective dose.  08/31/19 Wt Readings from Last 3 Encounters:  08/31/19 123 lb (55.8 kg)  08/03/19 121 lb (54.9 kg)  04/28/19 118 lb (53.5 kg)   The patient's insurance would not cover Vyvanse.  Therefore we switched her to Adderall XR 20 mg a day.  She has been taking this medication now for 1 month.  She is actually gained 2 pounds since her last visit.  She does not find it upsetting her stomach or causing her any abdominal pain beyond her normal issues.  Also her blood pressure has been well controlled on the medication.  Today it is 120/76.  She denies any palpitations or tachycardia.  She denies any anxiety or panic attacks on  the medication.  She seems to be tolerating it well.  However the current dose does not seem to be sufficient.  She states that the medication seems to wear off shortly after lunch.  She saw some benefit but not substantial benefit from it.  The other issue is that the medication cost her over $70 with her insurance.  However they would cover the immediate release Adderall. Past Medical History:  Diagnosis Date  . ADHD   . Asthma    childhood - no prob as adult  . Back pain   . 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 Depression    history - no meds  . Endodermal sinus tumor   . Enterocolitis   . Osteopenia   . Scoliosis   . 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 ABDOMINAL HYSTERECTOMY     TAH BSO-vaginectomy  . CHOLECYSTECTOMY OPEN  02/12/2014   T Tube with bile drain  . DIAGNOSTIC LAPAROSCOPY    . ERCP N/A 02/18/2014   Procedure: UNSUCCESSFUL ENDOSCOPIC RETROGRADE CHOLANGIOPANCREATOGRAPHY (ERCP);  Surgeon: Rogene Houston, MD;  Location: [facility];  Service: Gastroenterology;  Laterality: N/A;  . PELVIC LAPAROSCOPY  2009   Expl. Lap  . WISDOM TOOTH EXTRACTION     Current Outpatient Medications on File Prior to Visit  Medication Sig Dispense Refill  . albuterol (VENTOLIN HFA) 108 (90 Base) MCG/ACT inhaler INHALE 2 PUFFS BY MOUTH EVERY 4 TO 6 HOURS AS NEEDED FOR SHORTNESS OF BREATH 18 g 3  . amphetamine-dextroamphetamine (ADDERALL XR) 20 MG 24 hr capsule Take 1 capsule (20 mg total) by mouth every morning. 30 capsule 0  . conjugated estrogens (PREMARIN) vaginal cream Use twice weekly vaginally 42.5 g 12  . cyclobenzaprine (FLEXERIL) 10 MG tablet TAKE ONE TABLET BY MOUTH THREE TIMES DAILY AS NEEDED 60 tablet 5  . estradiol (ESTRACE) 0.5 MG tablet TAKE 1 TABLET BY MOUTH EVERY DAY 90 tablet 0  . estradiol (ESTRING) 2 MG vaginal ring Place 2 mg vaginally every 3 (three) months. follow package  directions 1 each 3  . estrogens-methylTEST (ESTRATEST) 1.25-2.5 MG tablet Take 1 tablet by mouth daily. 90 tablet 4  . fluconazole (DIFLUCAN) 100 MG tablet TAKE 2 TABLETS BY MOUTH WEEKLY AS NEEDED 15 tablet 0  . metoprolol tartrate (LOPRESSOR) 25 MG tablet TAKE 1/2 TABLET BY MOUTH TWICE DAILY 15 tablet 3  . oxyCODONE (ROXICODONE) 15 MG immediate release tablet Take 1 tablet (15 mg total) by mouth every 4 (four) hours as needed. Must last 30 days. 150 tablet 0  . promethazine (PHENERGAN) 25 MG tablet TAKE 1 TABLET BY MOUTH EVERY 8 HOURS AS NEEDED FOR NAUSEA AND VOMITING. 30 tablet 2   No current facility-administered medications on file prior to visit.    Allergies  Allergen Reactions  . Dog Epithelium Itching    Also Cat dander and Horse dander. Patient takes zyrtec as prophylactic.   . Influenza Vaccines     Allergy to binding protein   Social History   Socioeconomic History  . Marital status: Single    Spouse name: Not on file  . Number of children: Not on file  . Years of education: Not on file  . Highest education level: Not on file  Occupational History  . Not on file  Social Needs  . Financial resource strain: Not on file  . Food insecurity    Worry: Not on file    Inability: Not on file  . Transportation needs    Medical: Not on file    Non-medical: Not on file  Tobacco Use  . Smoking status: Former Smoker    Packs/day: 0.25    Years: 10.00    Pack years: 2.50    Types: E-cigarettes    Quit date: 06/13/2014    Years since quitting: 5.2  . Smokeless tobacco: Never Used  . Tobacco comment: vapor cigs  Substance and Sexual Activity  . Alcohol use: Yes    Comment: socially  . Drug use: No  . Sexual activity: Yes    Birth control/protection: Surgical, None    Comment: sexual hx question not answered  Lifestyle  . Physical activity    Days per week: Not on file  Minutes per session: Not on file  . Stress: Not on file  Relationships  . Social Product manager on phone: Not on file    Gets together: Not on file    Attends religious service: Not on file    Active member of club or organization: Not on file    Attends meetings of clubs or organizations: Not on file    Relationship status: Not on file  . Intimate partner violence    Fear of current or ex partner: Not on file    Emotionally abused: Not on file    Physically abused: Not on file    Forced sexual activity: Not on file  Other Topics Concern  . Not on file  Social History Narrative  . Not on file      Review of Systems  All other systems reviewed and are negative.      Objective:   Physical Exam Vitals signs reviewed.  Constitutional:      General: She is not in acute distress.    Appearance: Normal appearance. She is normal weight. She is not ill-appearing or toxic-appearing.  Cardiovascular:     Rate and Rhythm: Normal rate and regular rhythm.     Pulses: Normal pulses.     Heart sounds: Normal heart sounds. No murmur. No friction rub. No gallop.   Pulmonary:     Effort: Pulmonary effort is normal. No respiratory distress.     Breath sounds: Normal breath sounds. No stridor. No wheezing, rhonchi or rales.  Chest:     Chest wall: No tenderness.  Neurological:     Mental Status: She is alert.           Assessment & Plan:  ADHD (attention deficit hyperactivity disorder), combined type  We will switch the patient to Adderall 15 mg twice daily.  This will increase her total effective dose to 30 mg a day.  She will give [place] a call in 4 to 6 weeks and give [place] an update on how this is working.  If this is working better for her I would recheck the patient in 6 months.  As mentioned elsewhere in her medical records, her urine drug screen did not show any opiates.  This would be in contradiction to the amount of narcotics she reported taking.  Patient does not have a good explanation of why this occurred.  Either the urine drug screen was incorrect or the patient is  not taking the medication as prescribed.  Therefore I refused to refill the patient's narcotics.  She will continue to seek care at her pain clinic.  However I will refill the Adderall.

## 2019-09-18 ENCOUNTER — Other Ambulatory Visit: Payer: Self-pay | Admitting: Family Medicine

## 2019-09-23 ENCOUNTER — Telehealth: Payer: Self-pay | Admitting: Family Medicine

## 2019-09-23 NOTE — Telephone Encounter (Signed)
Patient called in stating that she was told to give an update on how Adderral is working. She states she is doing well with the medication taking it twice daily but she does feel that she needs to increase it. Please advise?

## 2019-09-25 ENCOUNTER — Other Ambulatory Visit: Payer: Self-pay | Admitting: Family Medicine

## 2019-09-25 MED ORDER — AMPHETAMINE-DEXTROAMPHETAMINE 20 MG PO TABS: 20.0000 mg | ORAL_TABLET | Freq: Two times a day (BID) | ORAL | 0 refills | Status: DC

## 2019-09-25 NOTE — Telephone Encounter (Signed)
I will increase adderall to 20 bid and lets leave it there for now.

## 2019-09-25 NOTE — Telephone Encounter (Signed)
Left message return call

## 2019-09-25 NOTE — Telephone Encounter (Signed)
Spoke with Patient and informed her that Dr. Dennard Schaumann sent in 20 MG Adderal BID. Patient verbalized understanding.

## 2019-10-02 ENCOUNTER — Other Ambulatory Visit: Payer: Self-pay | Admitting: Family Medicine

## 2019-10-06 ENCOUNTER — Other Ambulatory Visit: Payer: Self-pay

## 2019-10-06 DIAGNOSIS — R232 Flushing: Secondary | ICD-10-CM

## 2019-10-06 MED ORDER — ALBUTEROL SULFATE HFA 108 (90 BASE) MCG/ACT IN AERS: INHALATION_SPRAY | RESPIRATORY_TRACT | 3 refills | Status: DC

## 2019-10-06 MED ORDER — ESTRADIOL 0.5 MG PO TABS: 0.5000 mg | ORAL_TABLET | Freq: Every day | ORAL | 0 refills | Status: DC

## 2019-10-28 ENCOUNTER — Other Ambulatory Visit: Payer: Self-pay | Admitting: Family Medicine

## 2019-10-28 MED ORDER — AMPHETAMINE-DEXTROAMPHETAMINE 20 MG PO TABS: 20.0000 mg | ORAL_TABLET | Freq: Two times a day (BID) | ORAL | 0 refills | Status: DC

## 2019-10-28 NOTE — Telephone Encounter (Signed)
Pt requesting refill on Adderall      LOV: 08/31/19  LRF:   09/25/19

## 2019-11-05 ENCOUNTER — Other Ambulatory Visit: Payer: Self-pay | Admitting: Women's Health

## 2019-11-05 NOTE — Telephone Encounter (Signed)
[facility] for refills for both, annual in June

## 2019-11-09 ENCOUNTER — Ambulatory Visit: Payer: 59 | Admitting: Women's Health

## 2019-11-11 ENCOUNTER — Other Ambulatory Visit: Payer: Self-pay

## 2019-11-16 ENCOUNTER — Ambulatory Visit: Payer: 59 | Admitting: Women's Health

## 2019-11-16 ENCOUNTER — Other Ambulatory Visit: Payer: Self-pay

## 2019-11-16 ENCOUNTER — Encounter: Payer: Self-pay | Admitting: Women's Health

## 2019-11-16 VITALS — BP 142/80

## 2019-11-16 DIAGNOSIS — F439 Reaction to severe stress, unspecified: Secondary | ICD-10-CM | POA: Diagnosis not present

## 2019-11-16 DIAGNOSIS — M25472 Effusion, left ankle: Secondary | ICD-10-CM | POA: Diagnosis not present

## 2019-11-16 DIAGNOSIS — N952 Postmenopausal atrophic vaginitis: Secondary | ICD-10-CM | POA: Diagnosis not present

## 2019-11-16 DIAGNOSIS — M25471 Effusion, right ankle: Secondary | ICD-10-CM | POA: Diagnosis not present

## 2019-11-16 NOTE — Progress Notes (Signed)
39 year old SWF G0 presents with complaint of bilateral ankle/leg edema for the past few months.  States ankles and legs become tender to touch with the swelling.  History of hypertension, was taken off meds several years ago.  Does not relate ankle edema to diet, activity, exercise.  No change in diet, denies using salt or recent snack food ingestion.  Denies shortness of breath, cough or chest pain.  Denies vaginal discharge, urinary symptoms, abdominal pain or fever.  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  Has recently begun a new relationship but has not been able to have full penetration intercourse due to the dryness.  Using vaginal dilators and is making progress.  Under increased stress, had full custody of her sisters 3 children for over 4 years who had history of drug abuse/imprisoned.  Sister was then granted full custody of the 3 children after released from prison and is currently back on drugs and family members are caring for the children.  All 3 children have some learning and behavioral issues due to the drug abuse history of her sister.  Exam: Appears well.  Heart regular rate and rhythm, lungs clear throughout.  No CVAT.  Abdomen soft, nontender, external genitalia within normal limits, able to place a speculum but not open it which is an improvement. Bilateral ankle edema without pitting.  No erythema, tenderness, strong bilateral pulses at the pedal and posttibial sites.  Blood pressure 142/80  Bilateral ankle edema with slightly elevated blood pressure  Plan: Reviewed blood pressure slightly elevated best to follow-up with primary care for possible diuretic/blood pressure medication.  DASH diet discussed.  Encouraged to continue using the dilators that she has made progress for history of severe vaginal atrophy.  Condolences given regarding sisters relapse and stress with nieces and nephews.

## 2019-11-16 NOTE — Patient Instructions (Addendum)
B/P today 142/80  DASH Eating Plan DASH stands for "Dietary Approaches to Stop Hypertension." The DASH eating plan is a healthy eating plan that has been shown to reduce high blood pressure (hypertension). It may also reduce your risk for type 2 diabetes, heart disease, and stroke. The DASH eating plan may also help with weight loss. What are tips for following this plan?  General guidelines  Avoid eating more than 2,300 mg (milligrams) of salt (sodium) a day. If you have hypertension, you may need to reduce your sodium intake to 1,500 mg a day.  Limit alcohol intake to no more than 1 drink a day for nonpregnant women and 2 drinks a day for men. One drink equals 12 oz of beer, 5 oz of wine, or 1 oz of hard liquor.  Work with your health care provider to maintain a healthy body weight or to lose weight. Ask what an ideal weight is for you.  Get at least 30 minutes of exercise that causes your heart to beat faster (aerobic exercise) most days of the week. Activities may include walking, swimming, or biking.  Work with your health care provider or diet and nutrition specialist (dietitian) to adjust your eating plan to your individual calorie needs. Reading food labels   Check food labels for the amount of sodium per serving. Choose foods with less than 5 percent of the Daily Value of sodium. Generally, foods with less than 300 mg of sodium per serving fit into this eating plan.  To find whole grains, look for the word "whole" as the first word in the ingredient list. Shopping  Buy products labeled as "low-sodium" or "no salt added."  Buy fresh foods. Avoid canned foods and premade or frozen meals. Cooking  Avoid adding salt when cooking. Use salt-free seasonings or herbs instead of table salt or sea salt. Check with your health care provider or pharmacist before using salt substitutes.  Do not fry foods. Cook foods using healthy methods such as baking, boiling, grilling, and broiling  instead.  Cook with heart-healthy oils, such as olive, canola, soybean, or sunflower oil. Meal planning  Eat a balanced diet that includes: ? 5 or more servings of fruits and vegetables each day. At each meal, try to fill half of your plate with fruits and vegetables. ? Up to 6-8 servings of whole grains each day. ? Less than 6 oz of lean meat, poultry, or fish each day. A 3-oz serving of meat is about the same size as a deck of cards. One egg equals 1 oz. ? 2 servings of low-fat dairy each day. ? A serving of nuts, seeds, or beans 5 times each week. ? Heart-healthy fats. Healthy fats called Omega-3 fatty acids are found in foods such as flaxseeds and coldwater fish, like sardines, salmon, and mackerel.  Limit how much you eat of the following: ? Canned or prepackaged foods. ? Food that is high in trans fat, such as fried foods. ? Food that is high in saturated fat, such as fatty meat. ? Sweets, desserts, sugary drinks, and other foods with added sugar. ? Full-fat dairy products.  Do not salt foods before eating.  Try to eat at least 2 vegetarian meals each week.  Eat more home-cooked food and less restaurant, buffet, and fast food.  When eating at a restaurant, ask that your food be prepared with less salt or no salt, if possible. What foods are recommended? The items listed may not be a complete list. Talk  with your dietitian about what dietary choices are best for you. Grains Whole-grain or whole-wheat bread. Whole-grain or whole-wheat pasta. Brown rice. [place]ena Morrow. Bulgur. Whole-grain and low-sodium cereals. Pita bread. Low-fat, low-sodium crackers. Whole-wheat flour tortillas. Vegetables Fresh or frozen vegetables (raw, steamed, roasted, or grilled). Low-sodium or reduced-sodium tomato and vegetable juice. Low-sodium or reduced-sodium tomato sauce and tomato paste. Low-sodium or reduced-sodium canned vegetables. Fruits All fresh, dried, or frozen fruit. Canned fruit in  natural juice (without added sugar). Meat and other protein foods Skinless chicken or [place]. Ground chicken or [place]. Pork with fat trimmed off. Fish and seafood. Egg whites. Dried beans, peas, or lentils. Unsalted nuts, nut butters, and seeds. Unsalted canned beans. Lean cuts of beef with fat trimmed off. Low-sodium, lean deli meat. Dairy Low-fat (1%) or fat-free (skim) milk. Fat-free, low-fat, or reduced-fat cheeses. Nonfat, low-sodium ricotta or cottage cheese. Low-fat or nonfat yogurt. Low-fat, low-sodium cheese. Fats and oils Soft margarine without trans fats. Vegetable oil. Low-fat, reduced-fat, or light mayonnaise and salad dressings (reduced-sodium). Canola, safflower, olive, soybean, and sunflower oils. Avocado. Seasoning and other foods Herbs. Spices. Seasoning mixes without salt. Unsalted popcorn and pretzels. Fat-free sweets. What foods are not recommended? The items listed may not be a complete list. Talk with your dietitian about what dietary choices are best for you. Grains Baked goods made with fat, such as croissants, muffins, or some breads. Dry pasta or rice meal packs. Vegetables Creamed or fried vegetables. Vegetables in a cheese sauce. Regular canned vegetables (not low-sodium or reduced-sodium). Regular canned tomato sauce and paste (not low-sodium or reduced-sodium). Regular tomato and vegetable juice (not low-sodium or reduced-sodium). Angie Fava. Olives. Fruits Canned fruit in a light or heavy syrup. Fried fruit. Fruit in cream or butter sauce. Meat and other protein foods Fatty cuts of meat. Ribs. Fried meat. Berniece Salines. Sausage. Bologna and other processed lunch meats. Salami. Fatback. Hotdogs. Bratwurst. Salted nuts and seeds. Canned beans with added salt. Canned or smoked fish. Whole eggs or egg yolks. Chicken or [place] with skin. Dairy Whole or 2% milk, cream, and half-and-half. Whole or full-fat cream cheese. Whole-fat or sweetened yogurt. Full-fat cheese. Nondairy  creamers. Whipped toppings. Processed cheese and cheese spreads. Fats and oils Butter. Stick margarine. Lard. Shortening. Ghee. Bacon fat. Tropical oils, such as coconut, palm kernel, or palm oil. Seasoning and other foods Salted popcorn and pretzels. Onion salt, garlic salt, seasoned salt, table salt, and sea salt. Worcestershire sauce. Tartar sauce. Barbecue sauce. Teriyaki sauce. Soy sauce, including reduced-sodium. Steak sauce. Canned and packaged gravies. Fish sauce. Oyster sauce. Cocktail sauce. Horseradish that you find on the shelf. Ketchup. Mustard. Meat flavorings and tenderizers. Bouillon cubes. Hot sauce and Tabasco sauce. Premade or packaged marinades. Premade or packaged taco seasonings. Relishes. Regular salad dressings. Where to find more information:  National Heart, Lung, and [place]: https://wilson-eaton.com/  American Heart Association: www.heart.org Summary  The DASH eating plan is a healthy eating plan that has been shown to reduce high blood pressure (hypertension). It may also reduce your risk for type 2 diabetes, heart disease, and stroke.  With the DASH eating plan, you should limit salt (sodium) intake to 2,300 mg a day. If you have hypertension, you may need to reduce your sodium intake to 1,500 mg a day.  When on the DASH eating plan, aim to eat more fresh fruits and vegetables, whole grains, lean proteins, low-fat dairy, and heart-healthy fats.  Work with your health care provider or diet and nutrition specialist (dietitian) to adjust your  eating plan to your individual calorie needs. This information is not intended to replace advice given to you by your health care provider. Make sure you discuss any questions you have with your health care provider. Document Revised: 10/04/2017 Document Reviewed: 10/15/2016 Elsevier Patient Education  2020 [employer]. Do have anything a motorcycle in there

## 2019-11-17 ENCOUNTER — Encounter: Payer: Self-pay | Admitting: Family Medicine

## 2019-11-20 ENCOUNTER — Other Ambulatory Visit: Payer: Self-pay | Admitting: Women's Health

## 2019-11-23 ENCOUNTER — Other Ambulatory Visit: Payer: Self-pay | Admitting: Family Medicine

## 2019-11-24 ENCOUNTER — Other Ambulatory Visit: Payer: Self-pay

## 2019-11-24 MED ORDER — AMITRIPTYLINE HCL 50 MG PO TABS: ORAL_TABLET | ORAL | 0 refills | Status: DC

## 2019-11-25 ENCOUNTER — Other Ambulatory Visit: Payer: Self-pay | Admitting: Family Medicine

## 2019-11-25 NOTE — Telephone Encounter (Signed)
Pt requesting refill on Adderall      LOV: 08/31/2019  LRF:   10/28/19

## 2019-11-26 ENCOUNTER — Telehealth: Payer: Self-pay | Admitting: Family Medicine

## 2019-11-26 MED ORDER — AMPHETAMINE-DEXTROAMPHETAMINE 20 MG PO TABS: 20.0000 mg | ORAL_TABLET | Freq: Two times a day (BID) | ORAL | 0 refills | Status: DC

## 2019-11-26 NOTE — Telephone Encounter (Signed)
This was sent to the pharm this morning

## 2019-11-26 NOTE — Telephone Encounter (Signed)
Patient calling to get refill on her adderall  [facility] [place]

## 2019-12-07 ENCOUNTER — Other Ambulatory Visit: Payer: Self-pay | Admitting: Women's Health

## 2019-12-28 ENCOUNTER — Other Ambulatory Visit: Payer: Self-pay | Admitting: Family Medicine

## 2019-12-28 NOTE — Telephone Encounter (Signed)
Pt requesting refill on Adderall    & Flexeril  LOV: 08/31/19  LRF:   1/21/21Adderall  09/17/18 Flexeril

## 2019-12-28 NOTE — Telephone Encounter (Signed)
Patient would like a refill on her adderall and flexaril [facility] in [place].  [phone]

## 2019-12-29 MED ORDER — CYCLOBENZAPRINE HCL 10 MG PO TABS: ORAL_TABLET | ORAL | 1 refills | Status: DC

## 2019-12-29 MED ORDER — AMPHETAMINE-DEXTROAMPHETAMINE 20 MG PO TABS: 20.0000 mg | ORAL_TABLET | Freq: Two times a day (BID) | ORAL | 0 refills | Status: DC

## 2019-12-30 ENCOUNTER — Other Ambulatory Visit: Payer: Self-pay | Admitting: Family Medicine

## 2019-12-30 DIAGNOSIS — I10 Essential (primary) hypertension: Secondary | ICD-10-CM

## 2019-12-30 MED ORDER — METOPROLOL TARTRATE 25 MG PO TABS: 12.5000 mg | ORAL_TABLET | Freq: Two times a day (BID) | ORAL | 3 refills | Status: DC

## 2019-12-30 MED ORDER — AMITRIPTYLINE HCL 50 MG PO TABS: ORAL_TABLET | ORAL | 3 refills | Status: DC

## 2019-12-30 MED ORDER — ALBUTEROL SULFATE HFA 108 (90 BASE) MCG/ACT IN AERS: INHALATION_SPRAY | RESPIRATORY_TRACT | 3 refills | Status: DC

## 2020-01-12 ENCOUNTER — Other Ambulatory Visit: Payer: Self-pay | Admitting: Family Medicine

## 2020-01-12 DIAGNOSIS — R232 Flushing: Secondary | ICD-10-CM

## 2020-01-27 ENCOUNTER — Other Ambulatory Visit: Payer: Self-pay | Admitting: Family Medicine

## 2020-01-27 MED ORDER — ALBUTEROL SULFATE HFA 108 (90 BASE) MCG/ACT IN AERS: INHALATION_SPRAY | RESPIRATORY_TRACT | 3 refills | Status: DC

## 2020-01-27 NOTE — Telephone Encounter (Signed)
Pt requesting refill on Adderall  And Flexeril    LOV: 08/31/2019  LRF:   12/29/2019 on both

## 2020-01-28 MED ORDER — AMPHETAMINE-DEXTROAMPHETAMINE 20 MG PO TABS: 20.0000 mg | ORAL_TABLET | Freq: Two times a day (BID) | ORAL | 0 refills | Status: DC

## 2020-01-28 MED ORDER — CYCLOBENZAPRINE HCL 10 MG PO TABS: ORAL_TABLET | ORAL | 1 refills | Status: DC

## 2020-02-11 ENCOUNTER — Encounter: Payer: Self-pay | Admitting: Family Medicine

## 2020-02-11 ENCOUNTER — Ambulatory Visit (INDEPENDENT_AMBULATORY_CARE_PROVIDER_SITE_OTHER): Payer: Self-pay | Admitting: Family Medicine

## 2020-02-11 ENCOUNTER — Other Ambulatory Visit: Payer: Self-pay

## 2020-02-11 VITALS — BP 144/92 | HR 106 | Temp 98.1°F | Resp 14 | Ht 61.0 in | Wt 113.0 lb

## 2020-02-11 DIAGNOSIS — G894 Chronic pain syndrome: Secondary | ICD-10-CM

## 2020-02-11 DIAGNOSIS — R3915 Urgency of urination: Secondary | ICD-10-CM

## 2020-02-11 DIAGNOSIS — R102 Pelvic and perineal pain: Secondary | ICD-10-CM

## 2020-02-11 DIAGNOSIS — G8929 Other chronic pain: Secondary | ICD-10-CM

## 2020-02-11 DIAGNOSIS — Z8669 Personal history of other diseases of the nervous system and sense organs: Secondary | ICD-10-CM

## 2020-02-11 LAB — URINALYSIS, ROUTINE W REFLEX MICROSCOPIC
Bacteria, UA: NONE SEEN /HPF
Bilirubin Urine: NEGATIVE
Glucose, UA: NEGATIVE
Hyaline Cast: NONE SEEN /LPF
Ketones, ur: NEGATIVE
Leukocytes,Ua: NEGATIVE
Nitrite: NEGATIVE
Protein, ur: NEGATIVE
Specific Gravity, Urine: 1.005 (ref 1.001–1.03)
Squamous Epithelial / HPF: NONE SEEN /HPF (ref <=5)
WBC, UA: NONE SEEN /HPF (ref 0–5)
pH: 5.5 (ref 5.0–8.0)

## 2020-02-11 LAB — MICROSCOPIC MESSAGE

## 2020-02-11 MED ORDER — SUMATRIPTAN SUCCINATE 50 MG PO TABS: 50.0000 mg | ORAL_TABLET | ORAL | 0 refills | Status: DC | PRN

## 2020-02-11 MED ORDER — OXYCODONE HCL 15 MG PO TABS: 15.0000 mg | ORAL_TABLET | ORAL | 0 refills | Status: DC | PRN

## 2020-02-11 MED ORDER — FLUCONAZOLE 150 MG PO TABS: 150.0000 mg | ORAL_TABLET | Freq: Once | ORAL | 0 refills | Status: AC

## 2020-02-11 MED ORDER — SULFAMETHOXAZOLE-TRIMETHOPRIM 800-160 MG PO TABS: 1.0000 | ORAL_TABLET | Freq: Two times a day (BID) | ORAL | 0 refills | Status: DC

## 2020-02-11 NOTE — Progress Notes (Signed)
Subjective:    Patient ID: Pamela Wall, female    DOB: 1981/03/22, 39 y.o.   MRN: [id number]  Medication Refill    08/03/19 Patient is here today to establish care with me.  She is previously seen my partner who has retired.  Patient is currently going to a pain clinic.  She receives oxycodone 15 mg 5 times a day.  She states that she has been on this dose for the last 10 to 12 years.  She goes to a pain clinic to receive this medication although she is interested in having [place] fill the prescription for cost reasons.  Patient states that she was born with a rare malignancy and before age 35 she had undergone a complete and total abdominal hysterectomy.  She also underwent radiation therapy and chemotherapy which caused severe nerve damage and abdominal pain and adhesions.  As a result, she suffered severe intestinal damage.  Ultimately she underwent several exploratory surgeries due to her abdominal pain.  She had her gallbladder removed.  Apparently there was postoperative complications including a leaking bile duct and pancreatitis which required repeat surgery to remove scar tissue and adhesions.  She states that she has chronic severe enterocolitis.  She states that she has diarrhea 17 times per day.  She denies any melena or hematochezia.  She is seeing gastroenterology.  Colonoscopies have shown only scar tissue but no evidence of inflammatory bowel disease.  Currently she manages her diarrhea with Imodium and Lomotil as needed even though she is on oxycodone 15 mg 5 times a day.  She also reports occasional nausea which she has to take Phenergan for.  She denies any fevers or chills or melena or hematochezia.  She sees gynecology who performs her Pap smears and pelvic exams and also breast cancer screening.    Patient states that her family history is concerning for ADD.  Both her mother and her sister have been diagnosed.  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   Recently she is in jeopardy of losing her job due to chronically and recurrently being late.  She also reports symptoms concerning for ADHD.  Patient completed a self questionnaire.  Patient states that she struggles to maintain focus.  She states that she has to reread the same reports at least 5 times before she is able to process the information and to check for errors and to make sure that she has not left out anything.  Her job requires ongoing research and data analysis and she has a difficult time focusing on this and she frequently loses her place.  Patient states that she has weekly and monthly meetings with her superiors.  She has a difficult time staying focused in the meetings and often finds herself daydreaming and losing her focus.  They become frustrated with her and she is received warnings at her employment.  She states that the worst symptom is her inability to follow through on task.  She states that her company she often has 20+ projects going simultaneously.  She organizes and leads a team that manages this but she often quickly loses focus and fails to complete tasks and assignments in a timely manner.  She finds it difficult to complete task that require sustained mental effort.  She also reports frequently losing items such as her keys, her purse, and her phone.  She is easily distracted by external stimuli.  She also reports problems with hyperactivity and impulsivity.  She is  constantly tapping her feet tapping pins on her desk and fidgeting at work.  She has a difficult time staying seated for prolonged periods of time and frequently has to leave her seat at work.  She feels constantly restless and finds it impossible just to sit quietly and relax.  She states that she feels like she is on the go all the time.  She states that others find it difficult to keep up with her during dinners or during meetings.  She denies any issues with excessive talking.  However she does report problems  blurting out answers which frequently embarrasses her and makes her feel like she is being disrespectful.  She reports a very difficult time waiting her turn she also reports butting in the conversation.  At that time, my plan was: I states that I would be willing to refill her oxycodone 15 mg 5 times a day which would equate to 150 tablets/month.  However in order to do this I would require a urine drug screen every 6 months.  I would also need to review the records from her most recent pain clinic to rule out any history of abuse or diversion which the patient denies today but would be perfectly willing to get me her records for me to review.  Her past medical history is certainly complicated and her chronic pain seems legitimate and reasonable in the way that it is managed.  Her blood pressures well controlled today.  Unfortunately she has severe enterocolitis which is currently being managed suboptimally with Imodium and Lomotil.  She is not a candidate for Viberzi due to her cholecystectomy.  She also has symptoms of ADHD and would meet DSM-IV criteria for this diagnosis.  She is in jeopardy of losing her job.  Therefore I have recommended trying Vyvanse 30 mg a day and reassessing at an office visit in 1 month to recheck her blood pressure and monitor her weight on the medication to ensure an effective dose.  08/31/19 Wt Readings from Last 3 Encounters:  02/11/20 113 lb (51.3 kg)  08/31/19 123 lb (55.8 kg)  08/03/19 121 lb (54.9 kg)   The patient's insurance would not cover Vyvanse.  Therefore we switched her to Adderall XR 20 mg a day.  She has been taking this medication now for 1 month.  She is actually gained 2 pounds since her last visit.  She does not find it upsetting her stomach or causing her any abdominal pain beyond her normal issues.  Also her blood pressure has been well controlled on the medication.  Today it is 120/76.  She denies any palpitations or tachycardia.  She denies any anxiety  or panic attacks on the medication.  She seems to be tolerating it well.  However the current dose does not seem to be sufficient.  She states that the medication seems to wear off shortly after lunch.  She saw some benefit but not substantial benefit from it.  The other issue is that the medication cost her over $70 with her insurance.  However they would cover the immediate release Adderall.  At that time, my plan was: We will switch the patient to Adderall 15 mg twice daily.  This will increase her total effective dose to 30 mg a day.  She will give [place] a call in 4 to 6 weeks and give [place] an update on how this is working.  If this is working better for her I would recheck the patient in 6 months.  As mentioned elsewhere in her medical records, her urine drug screen did not show any opiates.  This would be in contradiction to the amount of narcotics she reported taking.  Patient does not have a good explanation of why this occurred.  Either the urine drug screen was incorrect or the patient is not taking the medication as prescribed.  Therefore I refused to refill the patient's narcotics.  She will continue to seek care at her pain clinic.  However I will refill the Adderall.  02/11/20 Patient states that she has recently been discharged from [facility] who is providing her pain management due to nonpayment of her bill.  Apparently she recently was laid off from work and has [employer].  For what ever reason, her previous provider would not accept her current insurance and consider nonpayment of her bill as a means for termination of their therapeutic relationship.  Patient states that she is just about to run out of her pain medication.  She is currently on oxycodone 15 mg 5 times a day/150 tablets/month.  As stated above, her urine drug test was failed at her last visit however the patient states that she saw her pain management clinic that same day and a urine test was positive.  Therefore she is  emphatic that she believes her test was incorrect.  She is also here today requesting for medication she can take to be used as needed for migraines.  At present she is on amitriptyline 50 mg a day for migraine prevention.  On amitriptyline, she states that she seldom has a headache however she is very concerned about the long-term potential side effects of amitriptyline I would rather take medication only as needed.  At the present time she states that she gets 3-4 migraines per month without the amitriptyline.  She states that she has never tried Imitrex.  She has never tried Topamax.  She has never tried [place] or [place].  Third issue is she reports dysuria, urgency, frequency for less than 12 hours.  Urinalysis today is completely normal except for trace hematuria.  She states that in the past if she does not get treatment within 24 hours she typically develops a severe bladder infection and she states that her symptoms feel like they always do with her previous bladder infections. Past Medical History:  Diagnosis Date  . ADHD   . Asthma    childhood - no prob as adult  . Back pain   . 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 Depression    history - no meds  . Endodermal sinus tumor   . Enterocolitis   . Osteopenia   . Scoliosis   . 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 ABDOMINAL HYSTERECTOMY     TAH BSO-vaginectomy  . CHOLECYSTECTOMY OPEN  02/12/2014   T Tube with bile drain  . DIAGNOSTIC LAPAROSCOPY    . ERCP N/A 02/18/2014   Procedure: UNSUCCESSFUL ENDOSCOPIC RETROGRADE CHOLANGIOPANCREATOGRAPHY (ERCP);  Surgeon: Rogene Houston, MD;  Location: [facility];  Service: Gastroenterology;  Laterality: N/A;  . PELVIC LAPAROSCOPY  2009   Expl. Lap  . WISDOM TOOTH EXTRACTION     Current Outpatient Medications on File Prior to Visit  Medication Sig Dispense Refill  . albuterol (VENTOLIN HFA) 108  (90 Base) MCG/ACT inhaler INHALE 2 PUFFS BY MOUTH EVERY 4 TO 6 HOURS AS NEEDED  FOR SHORTNESS OF BREATH 18 g 3  . amphetamine-dextroamphetamine (ADDERALL) 20 MG tablet Take 1 tablet (20 mg total) by mouth 2 (two) times daily. 60 tablet 0  . conjugated estrogens (PREMARIN) vaginal cream Use twice weekly vaginally 42.5 g 12  . cyclobenzaprine (FLEXERIL) 10 MG tablet TAKE ONE TABLET BY MOUTH THREE TIMES DAILY AS NEEDED 90 tablet 1  . estradiol (ESTRACE) 0.5 MG tablet TAKE 1 TABLET(0.5 MG) BY MOUTH DAILY 90 tablet 0  . estradiol (ESTRING) 2 MG vaginal ring Place 2 mg vaginally every 3 (three) months. follow package directions 1 each 3  . estrogens-methylTEST (ESTRATEST) 1.25-2.5 MG tablet TAKE 1 TABLET BY MOUTH DAILY 420 tablet 0  . fluconazole (DIFLUCAN) 100 MG tablet TAKE 2 TABLETS BY MOUTH WEEKLY AS NEEDED 15 tablet 0  . metoprolol tartrate (LOPRESSOR) 25 MG tablet Take 0.5 tablets (12.5 mg total) by mouth 2 (two) times daily. 15 tablet 3  . promethazine (PHENERGAN) 25 MG tablet TAKE 1 TABLET BY MOUTH EVERY 8 HOURS AS NEEDED FOR NAUSEA OR VOMITING 30 tablet 12  . amitriptyline (ELAVIL) 50 MG tablet TAKE 1 TABLET(50 MG) BY MOUTH AT BEDTIME (Patient not taking: Reported on 02/11/2020) 30 tablet 3   No current facility-administered medications on file prior to visit.   Allergies  Allergen Reactions  . Dog Epithelium Itching    Also Cat dander and Horse dander. Patient takes zyrtec as prophylactic.   . Influenza Vaccines     Allergy to binding protein   Social History   Socioeconomic History  . Marital status: Single    Spouse name: Not on file  . Number of children: Not on file  . Years of education: Not on file  . Highest education level: Not on file  Occupational History  . Not on file  Tobacco Use  . Smoking status: Former Smoker    Packs/day: 0.25    Years: 10.00    Pack years: 2.50    Types: E-cigarettes    Quit date: 06/13/2014    Years since quitting: 5.6  . Smokeless tobacco:  Never Used  . Tobacco comment: vapor cigs  Substance and Sexual Activity  . Alcohol use: Yes    Comment: socially  . Drug use: No  . Sexual activity: Yes    Birth control/protection: Surgical, None, Condom    Comment: sexual hx question not answered  Other Topics Concern  . Not on file  Social History Narrative  . Not on file   Social Determinants of Health   Financial Resource Strain:   . Difficulty of Paying Living Expenses:   Food Insecurity:   . Worried About Charity fundraiser in the Last Year:   . Arboriculturist in the Last Year:   Transportation Needs:   . Film/video editor (Medical):   Marland Kitchen Lack of Transportation (Non-Medical):   Physical Activity:   . Days of Exercise per Week:   . Minutes of Exercise per Session:   Stress:   . Feeling of Stress :   Social Connections:   . Frequency of Communication with Friends and Family:   . Frequency of Social Gatherings with Friends and Family:   . Attends Religious Services:   . Active Member of Clubs or Organizations:   . Attends Archivist Meetings:   Marland Kitchen Marital Status:   Intimate Partner Violence:   . Fear of Current or Ex-Partner:   . Emotionally Abused:   Marland Kitchen Physically Abused:   . Sexually  Abused:       Review of Systems  All other systems reviewed and are negative.      Objective:   Physical Exam Vitals reviewed.  Constitutional:      General: She is not in acute distress.    Appearance: Normal appearance. She is normal weight. She is not ill-appearing or toxic-appearing.  Cardiovascular:     Rate and Rhythm: Normal rate and regular rhythm.     Pulses: Normal pulses.     Heart sounds: Normal heart sounds. No murmur. No friction rub. No gallop.   Pulmonary:     Effort: Pulmonary effort is normal. No respiratory distress.     Breath sounds: Normal breath sounds. No stridor. No wheezing, rhonchi or rales.  Chest:     Chest wall: No tenderness.  Neurological:     Mental Status: She is  alert.           Assessment & Plan:  Urgency of urination - Plan: Urinalysis, Routine w reflex microscopic  Chronic pain syndrome - Plan: oxyCODONE (ROXICODONE) 15 MG immediate release tablet  History of migraine  Chronic pelvic pain in female  Urinalysis does show trace hematuria today.  I will give the patient Bactrim double strength tablets twice daily for 7 days but encouraged her not to get it for the next 24 hours but instead try to push fluids and take Azo.  If symptoms persist she can certainly start the Bactrim however urinalysis today is inconclusive.  She will discontinue the amitriptyline per her own request.  We will use Imitrex 50 mg p.o. daily as needed migraine headache.  I have recommended that she consider preventative strategy if she gets more than 5 severe headaches per month.  Certainly Topamax or Mobic would be an option.  Given the current situation I will give her 150 tablets of the oxycodone which will be from 1 month supply.  I will request her labs for office visit from her pain management specialist.  If there is a reason that they terminated her care beyond what she has mentioned, I will not refill her pain medication.  However if she is being honest about there cost determination, I would be willing to refill her pain medication.  She will need to be seen every 3 months and pass a urine drug screen.

## 2020-02-26 ENCOUNTER — Other Ambulatory Visit: Payer: Self-pay | Admitting: Family Medicine

## 2020-02-26 DIAGNOSIS — R232 Flushing: Secondary | ICD-10-CM

## 2020-02-26 DIAGNOSIS — Z8669 Personal history of other diseases of the nervous system and sense organs: Secondary | ICD-10-CM

## 2020-02-26 DIAGNOSIS — I10 Essential (primary) hypertension: Secondary | ICD-10-CM

## 2020-02-26 MED ORDER — AMPHETAMINE-DEXTROAMPHETAMINE 20 MG PO TABS: 20.0000 mg | ORAL_TABLET | Freq: Two times a day (BID) | ORAL | 0 refills | Status: DC

## 2020-02-26 MED ORDER — ESTRADIOL 0.5 MG PO TABS: ORAL_TABLET | ORAL | 2 refills | Status: DC

## 2020-02-26 MED ORDER — METOPROLOL TARTRATE 25 MG PO TABS: 12.5000 mg | ORAL_TABLET | Freq: Two times a day (BID) | ORAL | 3 refills | Status: DC

## 2020-02-26 MED ORDER — SUMATRIPTAN SUCCINATE 50 MG PO TABS: 50.0000 mg | ORAL_TABLET | ORAL | 0 refills | Status: DC | PRN

## 2020-02-26 MED ORDER — CYCLOBENZAPRINE HCL 10 MG PO TABS: ORAL_TABLET | ORAL | 1 refills | Status: DC

## 2020-02-26 NOTE — Telephone Encounter (Signed)
Pt requesting refill on Adderall  & Flexeril  LOV: 02/11/2020  LRF:  01/28/2020 & 01/28/2020

## 2020-03-04 ENCOUNTER — Ambulatory Visit: Payer: 59 | Admitting: Family Medicine

## 2020-03-11 ENCOUNTER — Other Ambulatory Visit: Payer: Self-pay | Admitting: Family Medicine

## 2020-03-11 DIAGNOSIS — G894 Chronic pain syndrome: Secondary | ICD-10-CM

## 2020-03-11 MED ORDER — AMPHETAMINE-DEXTROAMPHETAMINE 20 MG PO TABS: 20.0000 mg | ORAL_TABLET | Freq: Two times a day (BID) | ORAL | 0 refills | Status: DC

## 2020-03-11 MED ORDER — OXYCODONE HCL 15 MG PO TABS: 15.0000 mg | ORAL_TABLET | ORAL | 0 refills | Status: DC | PRN

## 2020-03-11 NOTE — Telephone Encounter (Signed)
Pt requesting refill on Adderall   (pt states that she wants 3 months rx on this - I did not change rx)  & Oxycodone  LOV: 02/11/2020  LRF:   02/26/2020 & 02/11/2020

## 2020-03-14 ENCOUNTER — Telehealth: Payer: Self-pay | Admitting: Family Medicine

## 2020-03-14 NOTE — Telephone Encounter (Signed)
Pt called and states that she talked to you about calling in her B12 injections. She states that she takes 1052mcg  Weekly.(?) OK to send in injections and supplies.

## 2020-03-14 NOTE — Telephone Encounter (Signed)
Who recommended weekly? Not sure that is appropriate.  Recommended monthly by FDA.

## 2020-03-21 ENCOUNTER — Other Ambulatory Visit: Payer: Self-pay | Admitting: Family Medicine

## 2020-03-21 MED ORDER — TOPIRAMATE 25 MG PO TABS
25.0000 mg | ORAL_TABLET | Freq: Two times a day (BID) | ORAL | 3 refills | Status: DC
Start: 2020-03-21 — End: 2020-08-22

## 2020-03-24 ENCOUNTER — Other Ambulatory Visit: Payer: Self-pay | Admitting: Family Medicine

## 2020-03-24 DIAGNOSIS — Z8669 Personal history of other diseases of the nervous system and sense organs: Secondary | ICD-10-CM

## 2020-03-24 DIAGNOSIS — G894 Chronic pain syndrome: Secondary | ICD-10-CM

## 2020-03-25 ENCOUNTER — Other Ambulatory Visit: Payer: Self-pay | Admitting: Family Medicine

## 2020-03-25 NOTE — Telephone Encounter (Signed)
Requested Prescriptions   Pending Prescriptions Disp Refills  . SUMAtriptan (IMITREX) 50 MG tablet [Pharmacy Med Name: SUMATRIPTAN 50MG  TAB] 10 tablet 0    Sig: TAKE 1 TABLET AT ONSET OF MIGRAINE, MAY REPEAT IN 2 HOURS IF HEADACHE PERSISTS OR RECURS.  Marland Kitchen oxyCODONE (ROXICODONE) 15 MG immediate release tablet [Pharmacy Med Name: OXYCODONE HCL 15 MG TABLET] 150 tablet 0    Sig: TAKE (1) TABLET BY MOUTH EVERY FOUR HOURS AS NEEDED. MUST LAST 30 DAYS.     Last OV 02/11/2020    Last written 03/11/2020 oxy                     02/26/2020 sum

## 2020-04-11 ENCOUNTER — Other Ambulatory Visit: Payer: Self-pay | Admitting: Family Medicine

## 2020-04-11 ENCOUNTER — Encounter: Payer: Self-pay | Admitting: Family Medicine

## 2020-04-11 DIAGNOSIS — Z8669 Personal history of other diseases of the nervous system and sense organs: Secondary | ICD-10-CM

## 2020-04-11 MED ORDER — AMPHETAMINE-DEXTROAMPHETAMINE 20 MG PO TABS: 20.0000 mg | ORAL_TABLET | Freq: Two times a day (BID) | ORAL | 0 refills | Status: DC

## 2020-04-11 MED ORDER — SUMATRIPTAN SUCCINATE 50 MG PO TABS: ORAL_TABLET | ORAL | 0 refills | Status: DC

## 2020-04-11 MED ORDER — CYANOCOBALAMIN 1000 MCG/ML IJ SOLN: 1000.0000 ug | INTRAMUSCULAR | 11 refills | Status: AC

## 2020-04-11 NOTE — Telephone Encounter (Signed)
Ok to refill??  Last office visit 02/11/2020.  Last refill 03/11/2020.

## 2020-04-22 ENCOUNTER — Encounter: Payer: Self-pay | Admitting: Family Medicine

## 2020-04-22 DIAGNOSIS — Z8669 Personal history of other diseases of the nervous system and sense organs: Secondary | ICD-10-CM

## 2020-04-22 DIAGNOSIS — G894 Chronic pain syndrome: Secondary | ICD-10-CM

## 2020-04-27 NOTE — Telephone Encounter (Signed)
Ok to refill??  Last office visit 02/11/2020.  Last refill on Oxycodone 03/25/2020.

## 2020-04-28 MED ORDER — ALBUTEROL SULFATE HFA 108 (90 BASE) MCG/ACT IN AERS: INHALATION_SPRAY | RESPIRATORY_TRACT | 3 refills | Status: DC

## 2020-04-28 MED ORDER — SUMATRIPTAN SUCCINATE 50 MG PO TABS: ORAL_TABLET | ORAL | 0 refills | Status: DC

## 2020-04-28 MED ORDER — OXYCODONE HCL 15 MG PO TABS: ORAL_TABLET | ORAL | 0 refills | Status: DC

## 2020-04-28 MED ORDER — CYCLOBENZAPRINE HCL 10 MG PO TABS: ORAL_TABLET | ORAL | 1 refills | Status: DC

## 2020-05-03 ENCOUNTER — Encounter: Payer: 59 | Admitting: Nurse Practitioner

## 2020-05-10 ENCOUNTER — Other Ambulatory Visit: Payer: Self-pay | Admitting: Family Medicine

## 2020-05-11 NOTE — Telephone Encounter (Signed)
Ok to refill??  Last office visit 04/22/2020.  Last refill 04/11/2020.

## 2020-06-06 ENCOUNTER — Other Ambulatory Visit: Payer: Self-pay | Admitting: Family Medicine

## 2020-06-06 DIAGNOSIS — G894 Chronic pain syndrome: Secondary | ICD-10-CM

## 2020-06-06 NOTE — Telephone Encounter (Signed)
Ok to refill??  Last office visit 02/11/2020.  Last refill on Oxycodone 04/28/2020.  Last refill on Adderall 05/12/2020.

## 2020-06-07 ENCOUNTER — Encounter: Payer: Self-pay | Admitting: Family Medicine

## 2020-07-12 ENCOUNTER — Encounter: Payer: Self-pay | Admitting: Family Medicine

## 2020-07-12 DIAGNOSIS — G894 Chronic pain syndrome: Secondary | ICD-10-CM

## 2020-07-12 MED ORDER — OXYCODONE HCL 15 MG PO TABS: 15.0000 mg | ORAL_TABLET | ORAL | 0 refills | Status: DC | PRN

## 2020-07-12 MED ORDER — AMPHETAMINE-DEXTROAMPHETAMINE 20 MG PO TABS: ORAL_TABLET | ORAL | 0 refills | Status: DC

## 2020-07-12 NOTE — Telephone Encounter (Signed)
Ok to refill??  Last office visit 02/11/2020.  Last refill 06/06/2020 on both.

## 2020-07-13 ENCOUNTER — Other Ambulatory Visit: Payer: Self-pay | Admitting: Family Medicine

## 2020-07-13 DIAGNOSIS — Z8669 Personal history of other diseases of the nervous system and sense organs: Secondary | ICD-10-CM

## 2020-07-27 ENCOUNTER — Ambulatory Visit: Payer: Self-pay | Admitting: Nurse Practitioner

## 2020-07-27 ENCOUNTER — Encounter: Payer: Self-pay | Admitting: Family Medicine

## 2020-07-27 ENCOUNTER — Other Ambulatory Visit: Payer: Self-pay

## 2020-07-29 ENCOUNTER — Encounter: Payer: Self-pay | Admitting: Family Medicine

## 2020-08-11 ENCOUNTER — Encounter: Payer: Self-pay | Admitting: Family Medicine

## 2020-08-11 DIAGNOSIS — G894 Chronic pain syndrome: Secondary | ICD-10-CM

## 2020-08-11 MED ORDER — OXYCODONE HCL 15 MG PO TABS: 15.0000 mg | ORAL_TABLET | ORAL | 0 refills | Status: DC | PRN

## 2020-08-11 MED ORDER — AMPHETAMINE-DEXTROAMPHETAMINE 20 MG PO TABS
ORAL_TABLET | ORAL | 0 refills | Status: DC
Start: 2020-08-11 — End: 2020-09-09

## 2020-08-11 NOTE — Telephone Encounter (Signed)
Ok to refill??  Last office visit 02/11/2020.  Last refill 07/12/2020 on both.

## 2020-08-22 ENCOUNTER — Other Ambulatory Visit: Payer: Self-pay | Admitting: Family Medicine

## 2020-08-29 ENCOUNTER — Encounter: Payer: Self-pay | Admitting: Nurse Practitioner

## 2020-08-29 DIAGNOSIS — Z0289 Encounter for other administrative examinations: Secondary | ICD-10-CM

## 2020-09-09 ENCOUNTER — Other Ambulatory Visit: Payer: Self-pay | Admitting: Family Medicine

## 2020-09-09 ENCOUNTER — Encounter: Payer: Self-pay | Admitting: Family Medicine

## 2020-09-09 DIAGNOSIS — G894 Chronic pain syndrome: Secondary | ICD-10-CM

## 2020-09-09 MED ORDER — OXYCODONE HCL 15 MG PO TABS: 15.0000 mg | ORAL_TABLET | ORAL | 0 refills | Status: DC | PRN

## 2020-09-09 MED ORDER — AMPHETAMINE-DEXTROAMPHETAMINE 20 MG PO TABS
ORAL_TABLET | ORAL | 0 refills | Status: DC
Start: 2020-09-09 — End: 2020-10-25

## 2020-09-24 ENCOUNTER — Other Ambulatory Visit: Payer: Self-pay | Admitting: Family Medicine

## 2020-09-24 DIAGNOSIS — Z8669 Personal history of other diseases of the nervous system and sense organs: Secondary | ICD-10-CM

## 2020-10-07 ENCOUNTER — Encounter: Payer: Self-pay | Admitting: Family Medicine

## 2020-10-07 DIAGNOSIS — G894 Chronic pain syndrome: Secondary | ICD-10-CM

## 2020-10-07 NOTE — Telephone Encounter (Signed)
Needs ov.  Too soon for both refills by over a week.  Denied.

## 2020-10-07 NOTE — Telephone Encounter (Signed)
Ok to refill??  Last office visit 02/11/2020.  Last refill 09/19/2020 on both.

## 2020-10-10 ENCOUNTER — Other Ambulatory Visit: Payer: Self-pay | Admitting: Family Medicine

## 2020-10-10 DIAGNOSIS — G894 Chronic pain syndrome: Secondary | ICD-10-CM

## 2020-10-10 NOTE — Telephone Encounter (Signed)
Ok to refill??  Last office visit 02/11/2020.  Last refill 09/09/2020.

## 2020-10-23 ENCOUNTER — Other Ambulatory Visit: Payer: Self-pay | Admitting: Family Medicine

## 2020-10-24 ENCOUNTER — Other Ambulatory Visit: Payer: Self-pay | Admitting: Family Medicine

## 2020-10-24 NOTE — Telephone Encounter (Signed)
Ok to refill??  Last office visit 02/11/2020.  Last refill 09/09/2020.

## 2020-10-25 MED ORDER — AMPHETAMINE-DEXTROAMPHETAMINE 20 MG PO TABS: ORAL_TABLET | ORAL | 0 refills | Status: DC

## 2020-10-25 NOTE — Telephone Encounter (Signed)
Please deny as duplicate

## 2020-10-27 ENCOUNTER — Other Ambulatory Visit: Payer: Self-pay | Admitting: Family Medicine

## 2020-11-02 ENCOUNTER — Telehealth: Payer: Self-pay | Admitting: Emergency Medicine

## 2020-11-02 DIAGNOSIS — R059 Cough, unspecified: Secondary | ICD-10-CM

## 2020-11-02 MED ORDER — BENZONATATE 100 MG PO CAPS: 100.0000 mg | ORAL_CAPSULE | Freq: Two times a day (BID) | ORAL | 0 refills | Status: DC | PRN

## 2020-11-02 MED ORDER — AZITHROMYCIN 250 MG PO TABS
ORAL_TABLET | ORAL | 0 refills | Status: DC
Start: 2020-11-02 — End: 2021-06-23

## 2020-11-02 NOTE — Progress Notes (Signed)
We are sorry that you are not feeling well.  Here is how we plan to help!  Based on your presentation I believe you most likely have A cough due to bacteria.  When patients have a fever and a productive cough with a change in color or increased sputum production, we are concerned about bacterial bronchitis.  If left untreated it can progress to pneumonia.  If your symptoms do not improve with your treatment plan it is important that you contact your provider.   I have prescribed Azithromyin 250 mg: two tablets now and then one tablet daily for 4 additonal days    In addition you may use A non-prescription cough medication called Robitussin DAC. Take 2 teaspoons every 8 hours or Delsym: take 2 teaspoons every 12 hours. and A prescription cough medication called Tessalon Perles 100mg . You may take 1-2 capsules every 8 hours as needed for your cough.   From your responses in the eVisit questionnaire you describe inflammation in the upper respiratory tract which is causing a significant cough.  This is commonly called Bronchitis and has four common causes:    Allergies  Viral Infections  Acid Reflux  Bacterial Infection Allergies, viruses and acid reflux are treated by controlling symptoms or eliminating the cause. An example might be a cough caused by taking certain blood pressure medications. You stop the cough by changing the medication. Another example might be a cough caused by acid reflux. Controlling the reflux helps control the cough.  USE OF BRONCHODILATOR ("RESCUE") INHALERS: There is a risk from using your bronchodilator too frequently.  The risk is that over-reliance on a medication which only relaxes the muscles surrounding the breathing tubes can reduce the effectiveness of medications prescribed to reduce swelling and congestion of the tubes themselves.  Although you feel brief relief from the bronchodilator inhaler, your asthma may actually be worsening with the tubes becoming more  swollen and filled with mucus.  This can delay other crucial treatments, such as oral steroid medications. If you need to use a bronchodilator inhaler daily, several times per day, you should discuss this with your provider.  There are probably better treatments that could be used to keep your asthma under control.     HOME CARE . Only take medications as instructed by your medical team. . Complete the entire course of an antibiotic. . Drink plenty of fluids and get plenty of rest. . Avoid close contacts especially the very young and the elderly . Cover your mouth if you cough or cough into your sleeve. . Always remember to wash your hands . A steam or ultrasonic humidifier can help congestion.   GET HELP RIGHT AWAY IF: . You develop worsening fever. . You become short of breath . You cough up blood. . Your symptoms persist after you have completed your treatment plan MAKE SURE YOU   Understand these instructions.  Will watch your condition.  Will get help right away if you are not doing well or get worse.  Your e-visit answers were reviewed by a board certified advanced clinical practitioner to complete your personal care plan.  Depending on the condition, your plan could have included both over the counter or prescription medications. If there is a problem please reply  once you have received a response from your provider. Your safety is important to .  If you have drug allergies check your prescription carefully.    You can use MyChart to ask questions about today's visit, request a  non-urgent call back, or ask for a work or school excuse for 24 hours related to this e-Visit. If it has been greater than 24 hours you will need to follow up with your provider, or enter a new e-Visit to address those concerns. You will get an e-mail in the next two days asking about your experience.  I hope that your e-visit has been valuable and will speed your recovery. Thank you for using  e-visits.  Approximately 5 minutes was used in reviewing the patient's chart, questionnaire, prescribing medications, and documentation.

## 2020-11-13 ENCOUNTER — Other Ambulatory Visit: Payer: Self-pay | Admitting: Family Medicine

## 2020-11-13 DIAGNOSIS — G894 Chronic pain syndrome: Secondary | ICD-10-CM

## 2020-11-14 MED ORDER — OXYCODONE HCL 15 MG PO TABS: 15.0000 mg | ORAL_TABLET | ORAL | 0 refills | Status: DC | PRN

## 2020-11-25 ENCOUNTER — Other Ambulatory Visit: Payer: Self-pay | Admitting: Family Medicine

## 2020-11-26 ENCOUNTER — Other Ambulatory Visit: Payer: Self-pay | Admitting: Family Medicine

## 2020-11-28 MED ORDER — AMPHETAMINE-DEXTROAMPHETAMINE 20 MG PO TABS: ORAL_TABLET | ORAL | 0 refills | Status: DC

## 2020-11-28 NOTE — Telephone Encounter (Signed)
Patient is requesting a refill of the following medications: Requested Prescriptions   Pending Prescriptions Disp Refills  . amphetamine-dextroamphetamine (ADDERALL) 20 MG tablet 60 tablet 0    Sig: TAKE (1) TABLET BY MOUTH TWICE DAILY.    Date of patient request: 11/25/20 Last office visit: 02/11/20 Date of last refill: 10/25/20 Last refill amount: 60 + 0 Follow up time period per chart: non scheduled

## 2020-11-28 NOTE — Telephone Encounter (Signed)
Patient is requesting a refill of the following medications: Requested Prescriptions   Pending Prescriptions Disp Refills   amphetamine-dextroamphetamine (ADDERALL) 20 MG tablet [Pharmacy Med Name: AMPHETAMINE SALTS 20MG  TAB] 60 tablet 0    Sig: TAKE (1) TABLET BY MOUTH TWICE DAILY.    Date of patient request: 11/25/20 Last office visit: 02/11/20 Date of last refill: 10/25/20 Last refill amount: 60 + 0 Follow up time period per chart: non scheduled

## 2020-12-14 ENCOUNTER — Other Ambulatory Visit: Payer: Self-pay | Admitting: Family Medicine

## 2020-12-14 DIAGNOSIS — G894 Chronic pain syndrome: Secondary | ICD-10-CM

## 2020-12-15 MED ORDER — OXYCODONE HCL 15 MG PO TABS: 15.0000 mg | ORAL_TABLET | ORAL | 0 refills | Status: DC | PRN

## 2021-01-15 ENCOUNTER — Other Ambulatory Visit: Payer: Self-pay | Admitting: Family Medicine

## 2021-01-15 DIAGNOSIS — Z8669 Personal history of other diseases of the nervous system and sense organs: Secondary | ICD-10-CM

## 2021-01-23 ENCOUNTER — Other Ambulatory Visit: Payer: Self-pay | Admitting: Family Medicine

## 2021-01-23 DIAGNOSIS — G894 Chronic pain syndrome: Secondary | ICD-10-CM

## 2021-01-24 NOTE — Telephone Encounter (Signed)
Ok to refill??  Last office visit 02/11/2020.  Last refill 12/15/2020.  Letter sent to patient to schedule OV.

## 2021-01-26 ENCOUNTER — Encounter: Payer: Self-pay | Admitting: Family Medicine

## 2021-01-26 DIAGNOSIS — G894 Chronic pain syndrome: Secondary | ICD-10-CM

## 2021-03-26 ENCOUNTER — Other Ambulatory Visit: Payer: Self-pay | Admitting: Family Medicine

## 2021-03-28 ENCOUNTER — Encounter: Payer: Self-pay | Admitting: Family Medicine

## 2021-03-28 DIAGNOSIS — G894 Chronic pain syndrome: Secondary | ICD-10-CM

## 2021-03-28 DIAGNOSIS — Z13220 Encounter for screening for lipoid disorders: Secondary | ICD-10-CM

## 2021-03-28 DIAGNOSIS — I10 Essential (primary) hypertension: Secondary | ICD-10-CM

## 2021-03-28 MED ORDER — AMPHETAMINE-DEXTROAMPHETAMINE 20 MG PO TABS: 20.0000 mg | ORAL_TABLET | Freq: Two times a day (BID) | ORAL | 0 refills | Status: DC

## 2021-03-28 MED ORDER — OXYCODONE HCL 15 MG PO TABS: ORAL_TABLET | ORAL | 0 refills | Status: DC

## 2021-03-28 NOTE — Telephone Encounter (Signed)
Lab orders placed for [facility] and e-mailed to patient.   Ok to refill Oxycodone and Adderall?? Last office visit 02/11/2020. Last refill on Oxycodone 01/24/2021. Last refill on Adderall 01/16/2021.  Please advise on appointment due to father's health issues.

## 2021-05-01 ENCOUNTER — Other Ambulatory Visit: Payer: Self-pay | Admitting: Family Medicine

## 2021-05-01 DIAGNOSIS — G894 Chronic pain syndrome: Secondary | ICD-10-CM

## 2021-05-01 NOTE — Telephone Encounter (Signed)
Ok to refill??  Last office visit 02/11/2020.  Last refill 03/28/2021.  Of note, letter was sent to patient to schedule OV on 01/24/2021.

## 2021-05-02 NOTE — Telephone Encounter (Signed)
PDMP reviewed.  Appears patient takes each of these medications chronically.  She has not had an office visit in over 1 year, however due to extenuating circumstances with her family, we are refilling these medications without an office visit.  Refill given in place of PCP who is out of office.

## 2021-06-08 ENCOUNTER — Other Ambulatory Visit: Payer: Self-pay | Admitting: Family Medicine

## 2021-06-08 DIAGNOSIS — Z8669 Personal history of other diseases of the nervous system and sense organs: Secondary | ICD-10-CM

## 2021-06-08 DIAGNOSIS — G894 Chronic pain syndrome: Secondary | ICD-10-CM

## 2021-06-09 ENCOUNTER — Other Ambulatory Visit: Payer: Self-pay | Admitting: Family Medicine

## 2021-06-09 NOTE — Telephone Encounter (Signed)
Ok to refill??  Last office visit 02/11/2020.  Last refill 05/02/2021.

## 2021-06-09 NOTE — Telephone Encounter (Signed)
Ok to refill??  Last office visit 02/11/2020.  Last refill 05/02/2021.   Of note, letter was sent to patient to schedule OV in March 2022. Another letter sent now.

## 2021-06-12 ENCOUNTER — Other Ambulatory Visit: Payer: Self-pay | Admitting: Family Medicine

## 2021-06-12 DIAGNOSIS — G894 Chronic pain syndrome: Secondary | ICD-10-CM

## 2021-06-12 NOTE — Telephone Encounter (Signed)
Ok to refill??  Last office visit 02/11/2020.  Last refill 05/02/2021.  Of note, letter sent to patient to schedule appointment on 01/24/2021 and 06/09/2021.

## 2021-06-19 ENCOUNTER — Other Ambulatory Visit: Payer: Self-pay | Admitting: Family Medicine

## 2021-06-19 DIAGNOSIS — G894 Chronic pain syndrome: Secondary | ICD-10-CM

## 2021-06-19 MED ORDER — OXYCODONE HCL 15 MG PO TABS: 15.0000 mg | ORAL_TABLET | ORAL | 0 refills | Status: DC | PRN

## 2021-06-23 ENCOUNTER — Telehealth (INDEPENDENT_AMBULATORY_CARE_PROVIDER_SITE_OTHER): Payer: Self-pay | Admitting: Family Medicine

## 2021-06-23 DIAGNOSIS — R232 Flushing: Secondary | ICD-10-CM

## 2021-06-23 DIAGNOSIS — Z8542 Personal history of malignant neoplasm of other parts of uterus: Secondary | ICD-10-CM

## 2021-06-23 DIAGNOSIS — G894 Chronic pain syndrome: Secondary | ICD-10-CM

## 2021-06-23 MED ORDER — CYCLOBENZAPRINE HCL 10 MG PO TABS: ORAL_TABLET | ORAL | 1 refills | Status: AC

## 2021-06-23 MED ORDER — ESTRADIOL 0.5 MG PO TABS: ORAL_TABLET | ORAL | 2 refills | Status: DC

## 2021-06-23 NOTE — Progress Notes (Signed)
Subjective:    Patient ID: Pamela Wall, female    DOB: 1980-12-04, 40 y.o.   MRN: [id number]  Medication Refill   08/03/19 Patient is here today to establish care with me.  She is previously seen my partner who has retired.  Patient is currently going to a pain clinic.  She receives oxycodone 15 mg 5 times a day.  She states that she has been on this dose for the last 10 to 12 years.  She goes to a pain clinic to receive this medication although she is interested in having [place] fill the prescription for cost reasons.  Patient states that she was born with a rare malignancy and before age 60 she had undergone a complete and total abdominal hysterectomy.  She also underwent radiation therapy and chemotherapy which caused severe nerve damage and abdominal pain and adhesions.  As a result, she suffered severe intestinal damage.  Ultimately she underwent several exploratory surgeries due to her abdominal pain.  She had her gallbladder removed.  Apparently there was postoperative complications including a leaking bile duct and pancreatitis which required repeat surgery to remove scar tissue and adhesions.  She states that she has chronic severe enterocolitis.  She states that she has diarrhea 17 times per day.  She denies any melena or hematochezia.  She is seeing gastroenterology.  Colonoscopies have shown only scar tissue but no evidence of inflammatory bowel disease.  Currently she manages her diarrhea with Imodium and Lomotil as needed even though she is on oxycodone 15 mg 5 times a day.  She also reports occasional nausea which she has to take Phenergan for.  She denies any fevers or chills or melena or hematochezia.  She sees gynecology who performs her Pap smears and pelvic exams and also breast cancer screening.    Patient states that her family history is concerning for ADD.  Both her mother and her sister have been diagnosed.  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   Recently she is in jeopardy of losing her job due to chronically and recurrently being late.  She also reports symptoms concerning for ADHD.  Patient completed a self questionnaire.  Patient states that she struggles to maintain focus.  She states that she has to reread the same reports at least 5 times before she is able to process the information and to check for errors and to make sure that she has not left out anything.  Her job requires ongoing research and data analysis and she has a difficult time focusing on this and she frequently loses her place.  Patient states that she has weekly and monthly meetings with her superiors.  She has a difficult time staying focused in the meetings and often finds herself daydreaming and losing her focus.  They become frustrated with her and she is received warnings at her employment.  She states that the worst symptom is her inability to follow through on task.  She states that her company she often has 20+ projects going simultaneously.  She organizes and leads a team that manages this but she often quickly loses focus and fails to complete tasks and assignments in a timely manner.  She finds it difficult to complete task that require sustained mental effort.  She also reports frequently losing items such as her keys, her purse, and her phone.  She is easily distracted by external stimuli.  She also reports problems with hyperactivity and impulsivity.  She is constantly  tapping her feet tapping pins on her desk and fidgeting at work.  She has a difficult time staying seated for prolonged periods of time and frequently has to leave her seat at work.  She feels constantly restless and finds it impossible just to sit quietly and relax.  She states that she feels like she is on the go all the time.  She states that others find it difficult to keep up with her during dinners or during meetings.  She denies any issues with excessive talking.  However she does report problems  blurting out answers which frequently embarrasses her and makes her feel like she is being disrespectful.  She reports a very difficult time waiting her turn she also reports butting in the conversation.  At that time, my plan was: I states that I would be willing to refill her oxycodone 15 mg 5 times a day which would equate to 150 tablets/month.  However in order to do this I would require a urine drug screen every 6 months.  I would also need to review the records from her most recent pain clinic to rule out any history of abuse or diversion which the patient denies today but would be perfectly willing to get me her records for me to review.  Her past medical history is certainly complicated and her chronic pain seems legitimate and reasonable in the way that it is managed.  Her blood pressures well controlled today.  Unfortunately she has severe enterocolitis which is currently being managed suboptimally with Imodium and Lomotil.  She is not a candidate for Viberzi due to her cholecystectomy.  She also has symptoms of ADHD and would meet DSM-IV criteria for this diagnosis.  She is in jeopardy of losing her job.  Therefore I have recommended trying Vyvanse 30 mg a day and reassessing at an office visit in 1 month to recheck her blood pressure and monitor her weight on the medication to ensure an effective dose.  08/31/19 Wt Readings from Last 3 Encounters:  02/11/20 113 lb (51.3 kg)  08/31/19 123 lb (55.8 kg)  08/03/19 121 lb (54.9 kg)   The patient's insurance would not cover Vyvanse.  Therefore we switched her to Adderall XR 20 mg a day.  She has been taking this medication now for 1 month.  She is actually gained 2 pounds since her last visit.  She does not find it upsetting her stomach or causing her any abdominal pain beyond her normal issues.  Also her blood pressure has been well controlled on the medication.  Today it is 120/76.  She denies any palpitations or tachycardia.  She denies any anxiety  or panic attacks on the medication.  She seems to be tolerating it well.  However the current dose does not seem to be sufficient.  She states that the medication seems to wear off shortly after lunch.  She saw some benefit but not substantial benefit from it.  The other issue is that the medication cost her over $70 with her insurance.  However they would cover the immediate release Adderall.  At that time, my plan was: We will switch the patient to Adderall 15 mg twice daily.  This will increase her total effective dose to 30 mg a day.  She will give [place] a call in 4 to 6 weeks and give [place] an update on how this is working.  If this is working better for her I would recheck the patient in 6 months.  As mentioned elsewhere in her medical records, her urine drug screen did not show any opiates.  This would be in contradiction to the amount of narcotics she reported taking.  Patient does not have a good explanation of why this occurred.  Either the urine drug screen was incorrect or the patient is not taking the medication as prescribed.  Therefore I refused to refill the patient's narcotics.  She will continue to seek care at her pain clinic.  However I will refill the Adderall.  02/11/20 Patient states that she has recently been discharged from [facility] who is providing her pain management due to nonpayment of her bill.  Apparently she recently was laid off from work and has [employer].  For what ever reason, her previous provider would not accept her current insurance and consider nonpayment of her bill as a means for termination of their therapeutic relationship.  Patient states that she is just about to run out of her pain medication.  She is currently on oxycodone 15 mg 5 times a day/150 tablets/month.  As stated above, her urine drug test was failed at her last visit however the patient states that she saw her pain management clinic that same day and a urine test was positive.  Therefore she is  emphatic that she believes her test was incorrect.  She is also here today requesting for medication she can take to be used as needed for migraines.  At present she is on amitriptyline 50 mg a day for migraine prevention.  On amitriptyline, she states that she seldom has a headache however she is very concerned about the long-term potential side effects of amitriptyline I would rather take medication only as needed.  At the present time she states that she gets 3-4 migraines per month without the amitriptyline.  She states that she has never tried Imitrex.  She has never tried Topamax.  She has never tried [place] or [place].  Third issue is she reports dysuria, urgency, frequency for less than 12 hours.  Urinalysis today is completely normal except for trace hematuria.  She states that in the past if she does not get treatment within 24 hours she typically develops a severe bladder infection and she states that her symptoms feel like they always do with her previous bladder infections.  At that time, my plan was:  Urinalysis does show trace hematuria today.  I will give the patient Bactrim double strength tablets twice daily for 7 days but encouraged her not to get it for the next 24 hours but instead try to push fluids and take Azo.  If symptoms persist she can certainly start the Bactrim however urinalysis today is inconclusive.  She will discontinue the amitriptyline per her own request.  We will use Imitrex 50 mg p.o. daily as needed migraine headache.  I have recommended that she consider preventative strategy if she gets more than 5 severe headaches per month.  Certainly Topamax or Mobic would be an option.  Given the current situation I will give her 150 tablets of the oxycodone which will be from 1 month supply.  I will request her labs for office visit from her pain management specialist.  If there is a reason that they terminated her care beyond what she has mentioned, I will not refill her pain  medication.  However if she is being honest about there cost determination, I would be willing to refill her pain medication.  She will need to be seen  every 3 months and pass a urine drug screen.  06/23/21 I have not physically seen the patient since April 2021.  Previously we had discussed office visits every 6 months for controlled substances.  However unfortunately, the patient's father was diagnosed with stage III adenocarcinoma of the throat.  He is doing poorly and has been transition to hospice.  As result, the patient was afraid to come to appointments due to possible exposure to COVID.  She was afraid that if she got exposed in the doctor's office she could bring it home to her father.  However it has now been 18 months since her last visit and therefore I refused any additional refills until she is physically seen.  However today she made a video appointment.  She is currently at home.  I am currently in my office.  Video appointment began roughly around 345.  It concluded around 405.  She consents to be seen via video.   it essentially consisted of [place] discussing the situation.  I explained to the patient that I will be unable to refill her pain medication or any controlled substances unless she is seen every 6 months.  I indicated to the patient that I need to have a urine drug test at least every 6 months to feel comfortable continuing to prescribe her controlled substances.  We will not be able to accurately do this with video conferences and remote urine test.  I apologize for the inconvenience however I feel that this is reasonable to request given the amount of pain medication that she is taking.  Therefore I have asked the patient to come in next week to give [place] a urine drug test.  She is also asking for her Flexeril and her Estrace tablets to be refilled.  She takes Estrace daily for estrogen replacement.  She takes Flexeril once at night for muscle stiffness.  She continues to take oxycodone 5  times a day/150 tablets/month for chronic pelvic pain.  She also uses Adderall to help with focus.  She states that she had lab work 3 to 4 months ago at another doctor's office however we have not received this. 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 Lap   WISDOM TOOTH EXTRACTION     Current Outpatient Medications on File Prior to Visit  Medication Sig Dispense Refill   amphetamine-dextroamphetamine (ADDERALL) 20 MG tablet TAKE (1) TABLET BY MOUTH TWICEDAILY. 60 tablet 0   conjugated estrogens (PREMARIN) vaginal cream Use twice weekly vaginally 42.5 g 12   cyanocobalamin (,VITAMIN B-12,) 1000 MCG/ML injection Inject 1 mL (1,000 mcg total) into the muscle every 30 (thirty)  days. 1 mL 11   cyclobenzaprine (FLEXERIL) 10 MG tablet TAKE ONE TABLET BY MOUTH THREE TIMES DAILY AS NEEDED 90 tablet 1   estradiol (ESTRACE) 0.5 MG tablet TAKE 1 TABLET(0.5 MG) BY MOUTH DAILY 90 tablet 2   mirtazapine (REMERON) 30 MG tablet Take 30 mg by mouth at bedtime.     oxyCODONE (ROXICODONE) 15 MG immediate release tablet Take 1 tablet (15 mg total) by mouth every 4 (four) hours as needed for pain. 150 tablet 0   promethazine (PHENERGAN) 25 MG tablet TAKE 1 TABLET BY MOUTH EVERY 8 HOURS AS NEEDED FOR  NAUSEA AND VOMITING. 30 tablet 2   SUMAtriptan (IMITREX) 50 MG tablet TAKE 1 TABLET AT ONSET OF MIGRAINE, MAY REPEAT IN 2 HOURS IF HEADACHE PERSISTS OR RECURS. 10 tablet 0   topiramate (TOPAMAX) 25 MG tablet TAKE ONE TABLET BY MOUTH TWICE DAILY. 60 tablet 0   albuterol (VENTOLIN HFA) 108 (90 Base) MCG/ACT inhaler INHALE 2 PUFFS BY MOUTH EVERY 4 TO 6 HOURS AS NEEDED FOR SHORTNESS OF BREATH 18 g 3   No current facility-administered medications on file prior to visit.     Allergies  Allergen Reactions   Dog Epithelium Itching    Also Cat dander and Horse dander. Patient takes zyrtec as prophylactic.    Influenza Vaccines     Allergy to binding protein   Social History   Socioeconomic History   Marital status: Single    Spouse name: Not on file   Number of children: Not on file   Years of education: Not on file   Highest education level: Not on file  Occupational History   Not on file  Tobacco Use   Smoking status: Former    Packs/day: 0.25    Years: 10.00    Pack years: 2.50    Types: E-cigarettes, Cigarettes    Quit date: 06/13/2014    Years since quitting: 7.0   Smokeless tobacco: Never   Tobacco comments:    vapor cigs  Vaping Use   Vaping Use: Every day  Substance and Sexual Activity   Alcohol use: Yes    Comment: socially   Drug use: No   Sexual activity: Yes    Birth control/protection: Surgical, None, Condom    Comment: sexual hx question not answered  Other Topics Concern   Not on file  Social History Narrative   Not on file   Social Determinants of Health   Financial Resource Strain: Not on file  Food Insecurity: Not on file  Transportation Needs: Not on file  Physical Activity: Not on file  Stress: Not on file  Social Connections: Not on file  Intimate Partner Violence: Not on file      Review of Systems  All other systems reviewed and are negative.     Objective:   Physical Exam Constitutional:      General: She is not in acute distress.     Appearance: Normal appearance. She is not ill-appearing or toxic-appearing.  Neurological:     General: No focal deficit present.     Mental Status: She is alert and oriented to person, place, and time. Mental status is at baseline.  Psychiatric:        Mood and Affect: Mood normal.        Behavior: Behavior normal.        Thought Content: Thought content normal.        Judgment: Judgment normal.  Assessment & Plan:  Chronic pain syndrome  History of uterine cancer Patient must come to the office next week for urine drug test to confirm compliance.  I insisted that she be seen physically in clinic every 6 months for urine drug screen as well as a medical assessment.  Without this, I will no longer be able to prescribe controlled medications to her.  I will refill her pain medication as well as the Flexeril and Estrace.  If the urine drug screen is inconsistent with her medication list, I will discontinue controlled medications.  I was very clear with the patient regarding the situation.

## 2021-07-28 ENCOUNTER — Encounter: Payer: Self-pay | Admitting: Family Medicine

## 2021-07-28 ENCOUNTER — Other Ambulatory Visit: Payer: Self-pay

## 2021-07-28 DIAGNOSIS — G894 Chronic pain syndrome: Secondary | ICD-10-CM

## 2021-07-28 DIAGNOSIS — Z0283 Encounter for blood-alcohol and blood-drug test: Secondary | ICD-10-CM

## 2021-07-28 NOTE — Telephone Encounter (Signed)
Ok to refill??  Last office visit 06/23/2021.  Last refill 06/19/2021.  Of note, patient did leave UDS on 07/28/2021.

## 2021-07-31 ENCOUNTER — Other Ambulatory Visit: Payer: Self-pay | Admitting: Family Medicine

## 2021-07-31 LAB — DRUG MONITOR, PANEL 1, W/CONF, URINE
Amphetamines: NEGATIVE ng/mL (ref <500)
Barbiturates: NEGATIVE ng/mL (ref <300)
Benzodiazepines: NEGATIVE ng/mL (ref <100)
Cocaine Metabolite: NEGATIVE ng/mL (ref <150)
Codeine: NEGATIVE ng/mL (ref <50)
Creatinine: 43.8 mg/dL (ref >=20.0)
Hydrocodone: NEGATIVE ng/mL (ref <50)
Hydromorphone: NEGATIVE ng/mL (ref <50)
Marijuana Metabolite: NEGATIVE ng/mL (ref <20)
Methadone Metabolite: NEGATIVE ng/mL (ref <100)
Morphine: NEGATIVE ng/mL (ref <50)
Norhydrocodone: NEGATIVE ng/mL (ref <50)
Noroxycodone: >10000 ng/mL — ABNORMAL HIGH (ref <50)
Opiates: NEGATIVE ng/mL (ref <100)
Oxidant: NEGATIVE ug/mL (ref <200)
Oxycodone: >10000 ng/mL — ABNORMAL HIGH (ref <50)
Oxycodone: POSITIVE ng/mL — AB (ref <100)
Oxymorphone: 3858 ng/mL — ABNORMAL HIGH (ref <50)
Phencyclidine: NEGATIVE ng/mL (ref <25)
pH: 5.6 (ref 4.5–9.0)

## 2021-07-31 LAB — DM TEMPLATE

## 2021-07-31 MED ORDER — AMPHETAMINE-DEXTROAMPHETAMINE 20 MG PO TABS: ORAL_TABLET | ORAL | 0 refills | Status: DC

## 2021-07-31 MED ORDER — OXYCODONE HCL 15 MG PO TABS: 15.0000 mg | ORAL_TABLET | ORAL | 0 refills | Status: DC | PRN

## 2021-08-28 ENCOUNTER — Other Ambulatory Visit: Payer: Self-pay

## 2021-08-29 ENCOUNTER — Other Ambulatory Visit: Payer: Self-pay | Admitting: Family Medicine

## 2021-08-29 ENCOUNTER — Encounter: Payer: Self-pay | Admitting: Family Medicine

## 2021-08-29 DIAGNOSIS — Z8669 Personal history of other diseases of the nervous system and sense organs: Secondary | ICD-10-CM

## 2021-08-29 DIAGNOSIS — G894 Chronic pain syndrome: Secondary | ICD-10-CM

## 2021-08-29 MED ORDER — OXYCODONE HCL 15 MG PO TABS: 15.0000 mg | ORAL_TABLET | ORAL | 0 refills | Status: DC | PRN

## 2021-08-29 MED ORDER — SUMATRIPTAN SUCCINATE 50 MG PO TABS: ORAL_TABLET | ORAL | 0 refills | Status: DC

## 2021-08-29 MED ORDER — AMPHETAMINE-DEXTROAMPHETAMINE 20 MG PO TABS: ORAL_TABLET | ORAL | 0 refills | Status: DC

## 2021-08-29 NOTE — Telephone Encounter (Signed)
Ok to refill??  Last office visit 06/23/2021.  Last refill 07/31/2021.

## 2021-09-04 ENCOUNTER — Encounter: Payer: Self-pay | Admitting: Family Medicine

## 2021-09-27 ENCOUNTER — Encounter: Payer: Self-pay | Admitting: Family Medicine

## 2021-09-27 ENCOUNTER — Other Ambulatory Visit: Payer: Self-pay | Admitting: Family Medicine

## 2021-09-27 DIAGNOSIS — G894 Chronic pain syndrome: Secondary | ICD-10-CM

## 2021-09-27 DIAGNOSIS — Z8669 Personal history of other diseases of the nervous system and sense organs: Secondary | ICD-10-CM

## 2021-09-27 MED ORDER — OXYCODONE HCL 15 MG PO TABS: 15.0000 mg | ORAL_TABLET | ORAL | 0 refills | Status: AC | PRN

## 2021-09-27 MED ORDER — AMPHETAMINE-DEXTROAMPHETAMINE 20 MG PO TABS: ORAL_TABLET | ORAL | 0 refills | Status: DC

## 2021-09-27 NOTE — Telephone Encounter (Signed)
LOV 06/23/21 (VIDEO) Last refill  Oxycodone 08/29/21, #150, 0 refills Adderall 08/29/21, #60, 0 refills  Please review, thanks!

## 2021-09-27 NOTE — Telephone Encounter (Signed)
PDMP reviewed.  Patient appears to take these medications chronically.  Refill given in place of PCP who is out of the office and dated for 30 days from last fill per PDMP.

## 2021-10-23 ENCOUNTER — Encounter: Payer: Self-pay | Admitting: Family Medicine

## 2021-10-27 ENCOUNTER — Other Ambulatory Visit: Payer: Self-pay | Admitting: Nurse Practitioner

## 2021-10-27 DIAGNOSIS — G894 Chronic pain syndrome: Secondary | ICD-10-CM

## 2021-10-30 ENCOUNTER — Encounter: Payer: Self-pay | Admitting: Family Medicine

## 2021-10-31 ENCOUNTER — Encounter: Payer: Self-pay | Admitting: Family Medicine

## 2021-10-31 ENCOUNTER — Other Ambulatory Visit: Payer: Self-pay | Admitting: Nurse Practitioner

## 2021-10-31 DIAGNOSIS — G894 Chronic pain syndrome: Secondary | ICD-10-CM

## 2023-02-11 ENCOUNTER — Telehealth: Payer: Self-pay

## 2023-02-11 NOTE — Transitions of Care (Post Inpatient/ED Visit) (Signed)
   02/11/2023  Name: Pamela Wall MRN: [id number] DOB: 02/18/1981  Today's TOC FU Call Status: Today's TOC FU Call Status:: Unsuccessul Call (1st Attempt) Unsuccessful Call (1st Attempt) Date: 02/11/23  Attempted to reach the patient regarding the most recent Inpatient/ED visit.  Follow Up Plan: Additional outreach attempts will be made to reach the patient to complete the Transitions of Care (Post Inpatient/ED visit) call.   Signature Kandis Fantasia, LPN [facility] Advisor [place] [facility] You Are. We Are. [employer] # [phone]

## 2023-02-18 ENCOUNTER — Telehealth: Payer: Self-pay

## 2023-02-18 NOTE — Transitions of Care (Post Inpatient/ED Visit) (Signed)
   02/18/2023  Name: NILI WARMKESSEL MRN: [id number] DOB: 08-Aug-1981  Today's TOC FU Call Status: Today's TOC FU Call Status:: Unsuccessful Call (2nd Attempt) Unsuccessful Call (2nd Attempt) Date: 02/18/23  Attempted to reach the patient regarding the most recent Inpatient/ED visit.  Follow Up Plan: No further outreach attempts will be made at this time. We have been unable to contact the patient.  Signature Kandis Fantasia, LPN [facility] Advisor [place] [facility] You Are. We Are. [employer] # [phone]

## 2023-11-20 ENCOUNTER — Encounter (INDEPENDENT_AMBULATORY_CARE_PROVIDER_SITE_OTHER): Payer: Self-pay | Admitting: *Deleted

## 2024-03-10 ENCOUNTER — Ambulatory Visit: Admitting: Gastroenterology

## 2024-03-10 NOTE — Progress Notes (Deleted)
 GI Office Note    Referring Provider: Austine Lefort, MD Primary Care Physician:  Austine Lefort, MD  Primary Gastroenterologist:  Chief Complaint   No chief complaint on file.    History of Present Illness   Pamela Wall is a 43 y.o. female presenting today at the request of EDP for abdominal pain and vomiting.  Labs   CT A/P without contrast 02/08/2024: -no acute findings, no bowel obstruction -nonspecific biliary ductal dilatation postcholecystectomy.  CT A/P with contrast 02/07/24: 1. Diffusely fluid-filled small bowel with numerous loops of  proximal and mid jejunum demonstrate hyperemia and mild bowel wall  thickening suggesting changes of a infectious or inflammatory long  segment enteritis. No evidence of obstruction or perforation.  2. Trace ascites within the pelvis which appears rim enhancing and  possibly loculated. This was described on prior examination though  images are unavailable for direct comparison.  3. Status post cholecystectomy. Moderate intra and extrahepatic  biliary ductal dilation, described on prior examination, is likely  related to prior cholecystectomy. Correlation with liver enzymes,  however, would be helpful to exclude an obstructive process.  4. Aortic atherosclerosis.     Medications   Current Outpatient Medications  Medication Sig Dispense Refill   albuterol  (VENTOLIN  HFA) 108 (90 Base) MCG/ACT inhaler INHALE 2 PUFFS BY MOUTH EVERY 4 TO 6 HOURS AS NEEDED FOR SHORTNESS OF BREATH 18 g 3   amphetamine -dextroamphetamine  (ADDERALL) 20 MG tablet TAKE (1) TABLET BY MOUTH TWICEDAILY. 60 tablet 0   conjugated estrogens  (PREMARIN ) vaginal cream Use twice weekly vaginally 42.5 g 12   cyanocobalamin  (,VITAMIN B-12,) 1000 MCG/ML injection Inject 1 mL (1,000 mcg total) into the muscle every 30 (thirty) days. 1 mL 11   cyclobenzaprine  (FLEXERIL ) 10 MG tablet TAKE ONE TABLET BY MOUTH THREE TIMES DAILY AS NEEDED 90 tablet 1   estradiol   (ESTRACE ) 0.5 MG tablet TAKE 1 TABLET(0.5 MG) BY MOUTH DAILY 90 tablet 2   mirtazapine (REMERON) 30 MG tablet Take 30 mg by mouth at bedtime.     promethazine  (PHENERGAN ) 25 MG tablet TAKE 1 TABLET BY MOUTH EVERY 8 HOURS AS NEEDED FOR NAUSEA AND VOMITING. 30 tablet 2   SUMAtriptan  (IMITREX ) 50 MG tablet May repeat in 2 hours if headache persists or recurs. 10 tablet 0   topiramate  (TOPAMAX ) 25 MG tablet TAKE ONE TABLET BY MOUTH TWICE DAILY. 60 tablet 0   No current facility-administered medications for this visit.    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 Lap   WISDOM TOOTH EXTRACTION      Past Family History   Family History  Problem Relation Age of Onset   Diabetes Father    Breast cancer Paternal Grandmother    Diabetes Paternal Grandmother     Past Social History   Social History   Socioeconomic History   Marital status: Single    Spouse name: Not on file   Number of children: Not on file   Years of education: Not on file   Highest  education level: Not on file  Occupational History   Not on file  Tobacco Use   Smoking status: Former    Current packs/day: 0.00    Average packs/day: 0.3 packs/day for 10.0 years (2.5 ttl pk-yrs)    Types: E-cigarettes, Cigarettes    Start date: 06/13/2004    Quit date: 06/13/2014    Years since quitting: 9.7   Smokeless tobacco: Never   Tobacco comments:    vapor cigs  Vaping Use   Vaping status: Every Day  Substance and Sexual Activity   Alcohol use: Yes    Comment: socially   Drug use: No   Sexual activity: Yes    Birth control/protection: Surgical, None, Condom    Comment: sexual hx question not answered  Other Topics Concern   Not on file  Social History Narrative   Not on file   Social Drivers of Health   Financial Resource Strain: Not on file  Food Insecurity: Not on file  Transportation Needs: Not on file  Physical Activity: Not on file  Stress: Not on file  Social Connections: Not on file  Intimate Partner Violence: Not on file    Review of Systems   General: Negative for anorexia, weight loss, fever, chills, fatigue, weakness. Eyes: Negative for vision changes.  ENT: Negative for hoarseness, difficulty swallowing , nasal congestion. CV: Negative for chest pain, angina, palpitations, dyspnea on exertion, peripheral edema.  Respiratory: Negative for dyspnea at rest, dyspnea on exertion, cough, sputum, wheezing.  GI: See history of present illness. GU:  Negative for dysuria, hematuria, urinary incontinence, urinary frequency, nocturnal urination.  MS: Negative for joint pain, low back pain.  Derm: Negative for rash or itching.  Neuro: Negative for weakness, abnormal sensation, seizure, frequent headaches, memory loss,  confusion.  Psych: Negative for anxiety, depression, suicidal ideation, hallucinations.  Endo: Negative for unusual weight change.  Heme: Negative for bruising or bleeding. Allergy: Negative for rash or hives.  Physical Exam   LMP  08/15/1984    General: Well-nourished, well-developed in no acute distress.  Head: Normocephalic, atraumatic.   Eyes: Conjunctiva pink, no icterus. Mouth: Oropharyngeal mucosa moist and pink , no lesions erythema or exudate. Neck: Supple without thyromegaly, masses, or lymphadenopathy.  Lungs: Clear to auscultation bilaterally.  Heart: Regular rate and rhythm, no murmurs rubs or gallops.  Abdomen: Bowel sounds are normal, nontender, nondistended, no hepatosplenomegaly or masses,  no abdominal bruits or hernia, no rebound or guarding.   Rectal: *** Extremities: No lower extremity edema. No clubbing or deformities.  Neuro: Alert and oriented x 4 , grossly normal neurologically.  Skin: Warm and dry, no rash or jaundice.   Psych: Alert and cooperative, normal mood and affect.  Labs   *** Imaging Studies   No results found.  Assessment       PLAN   ***  Trudie Fuse. Harles Lied, MHS, PA-C [facility]

## 2024-03-11 ENCOUNTER — Encounter: Payer: Self-pay | Admitting: Gastroenterology

## 2024-04-20 ENCOUNTER — Telehealth: Payer: Self-pay | Admitting: Gastroenterology

## 2024-04-20 ENCOUNTER — Ambulatory Visit (INDEPENDENT_AMBULATORY_CARE_PROVIDER_SITE_OTHER): Admitting: Gastroenterology

## 2024-04-20 DIAGNOSIS — R112 Nausea with vomiting, unspecified: Secondary | ICD-10-CM

## 2024-04-20 NOTE — Telephone Encounter (Signed)
 Patient no showed today. Patient can have appointment if she reaches out.

## 2024-04-20 NOTE — Progress Notes (Signed)
 Patient no showed.

## 2024-05-05 ENCOUNTER — Ambulatory Visit (INDEPENDENT_AMBULATORY_CARE_PROVIDER_SITE_OTHER): Admitting: Gastroenterology

## 2024-05-05 ENCOUNTER — Encounter: Payer: Self-pay | Admitting: Gastroenterology

## 2024-05-05 VITALS — BP 120/83 | HR 90 | Temp 98.9°F | Ht 61.0 in | Wt 103.0 lb

## 2024-05-05 DIAGNOSIS — R7989 Other specified abnormal findings of blood chemistry: Secondary | ICD-10-CM | POA: Diagnosis not present

## 2024-05-05 DIAGNOSIS — R112 Nausea with vomiting, unspecified: Secondary | ICD-10-CM | POA: Diagnosis not present

## 2024-05-05 DIAGNOSIS — K838 Other specified diseases of biliary tract: Secondary | ICD-10-CM | POA: Insufficient documentation

## 2024-05-05 DIAGNOSIS — K529 Noninfective gastroenteritis and colitis, unspecified: Secondary | ICD-10-CM

## 2024-05-05 DIAGNOSIS — R1110 Vomiting, unspecified: Secondary | ICD-10-CM | POA: Insufficient documentation

## 2024-05-05 DIAGNOSIS — R1010 Upper abdominal pain, unspecified: Secondary | ICD-10-CM | POA: Diagnosis not present

## 2024-05-05 DIAGNOSIS — R109 Unspecified abdominal pain: Secondary | ICD-10-CM | POA: Insufficient documentation

## 2024-05-05 DIAGNOSIS — A09 Infectious gastroenteritis and colitis, unspecified: Secondary | ICD-10-CM

## 2024-05-05 DIAGNOSIS — K219 Gastro-esophageal reflux disease without esophagitis: Secondary | ICD-10-CM | POA: Insufficient documentation

## 2024-05-05 DIAGNOSIS — R197 Diarrhea, unspecified: Secondary | ICD-10-CM | POA: Insufficient documentation

## 2024-05-05 MED ORDER — PANTOPRAZOLE SODIUM 40 MG PO TBEC: 40.0000 mg | DELAYED_RELEASE_TABLET | Freq: Every day | ORAL | 5 refills | Status: DC

## 2024-05-05 NOTE — Patient Instructions (Signed)
 Start pantoprazole 40mg  daily before breakfast.  Continue dicyclomine, take at least once daily every morning, may take second later if needed for abdominal cramping or loose stools.   Please complete labs and stools as soon as possible.

## 2024-05-05 NOTE — Progress Notes (Signed)
 GI Office Note    Referring Provider: Duanne Butler DASEN, MD Primary Care Physician:  No primary care provider on file.  Primary Gastroenterologist: Ozell Hollingshead, MD   Chief Complaint   Chief Complaint  Patient presents with   Abdominal Pain     History of Present Illness   Pamela Wall is a 43 y.o. female presenting today for abdominal pain and vomiting at the request of Dr. Evonnie Redder from [facility] ED.  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 She has had history of distorted torso related to her treatments with chronic pain. In 2009, she had diagnostic laparoscopy for concern for endoluminal small bowel tumor and was noted to have no endoluminal tumor but had proximal jejunal small serosal nodules c/w scarring from prior radiation. In 2015, she underwent laparoscopic converted to open cholecystectomy, complicated by bile duct injury, recognized intraoperatively and repaired. She developed continued bile leak, attempted ERCP 02/2014 with normal ampulla of Vater with small orifice. Unsuccessful ERCP with inability to cannulate CBD. Subsequently had successful ERCPs at [facility] to follow with sphincterotomy with stent placement for right intrahepatic duct injury and bile leak and subsuequent removal. Cholangiogram revealing variant right posterior branch with minimal stricture at the level of the take-off at the common hepatic duct. MRI liver 03/2014 showed variant biliary anatomy (right posterior duct inserts low into the common hepatic duct) with short segment narrowing of the posterior right duct at its insertion into the common hepatic duct (CHD); common hepatic duct is also focally narrowed and slightly distorted at this site.   Today: has been over 10 years since she has followed with GI. She continues with lifelong diarrhea. She has chronic intermittent abdominal pain and  vomiting. Her chronic pain and nausea managed by PCP. Takes oxycodone  10mg  up to five per day. Has both phenergan  PO and supp up to four daily.   Back in April she had to be seen at [facility] ED twice within one week. She has episodes of of feeling her stomach tense up. Starts out like everything stops in the pit of her stomach. Pressure builds up. States her abdominal wall muscles were not put back right after her gallbladder surgery and during these episodes she will seeing a large spasm that moves from ruq to epigastric region, almost like a baby's foot is kicking her. Sometimes starts when she goes to roll over or sit up. She has a lot of pain in the right lower anterior ribs/pelvis/back related to abnormal development post radiation at 65 months old. When these episodes of pressure build up, she then develops vomiting. Ultimately she feels gas move and then diarrhea starts back. Feels like something is obstructing.   She has cut out processed foods. Using APP that tells her about potential bad food additives. She seems to feel some better with regards to bowels since making changes. She has chronic diarrhea. Typically 7-8 bristol 6-7 stools daily. Gets up 3-4 times each night to have BM. Rare solid stool. Black stools only with kaopectate. No brbpr. Has postprandial urgency. Does not matter what she eats. Has been giving bentyl but does not take regularly. Takes if stool is pure water  or increased abd pain but does not find very helpful. Denies dysphagia. Has occasional heartburn, can be terrible. No hematemesis. Weight fluctuates between 100-110 pounds.   She was unaware her recent labs showed elevated LFTs. Started emgality about  3 months ago. No recent blood transfusions, may have had in 2015. Newest tattoo in 2005, professional placed. No etoh use past/present. No illicit drug use.   Has been under a lot of stress, lost five family members in the past few years.   Was on fosamax around age 51 for  osteopenia. Not on for years. Last bone density study years ago.   History of B12 deficiency, takes monthly injections.   Labs 02/2024: etoh <3, WBC 13.4, Hgb 11.6, Plt 294,  K 3.2, Na 144, BUN 5, Cre 1.14, alb 3.1, Tbili 1.0, AP 273, AST 176, ALT 85. Lipase 17. The week prior, Tbili 1.2, AP 180, AST 165, ALT 66.  Labs 02/2023: LFTs normal.  CT A/P without contrast 02/2024; No acute findings in the abdomen or pelvis. No findings of bowel obstruction.  Nonspecific biliary ductal dilatation postcholecystectomy. Correlate with LFTs. Similar to 02/2023 CT.   CT A/P 02/2023 with There is moderate intra  and extrahepatic biliary ductal dilation with the extrahepatic bile  duct measuring up to 14 mm and tapering at the ampulla. No definite  obstructing mass is identified. Similar to prior exams in 2019. Also with diffusely fluid filled small bowel with numerous loops of proximal and mid jejunum demonstrate hyperemia and mild bowel wall thickening.     MRI liver WWO contrast 03/2014: No evidence of persistent biliary leak on today's examination.  Variant biliary anatomy again noted (right posterior duct inserts low into the common hepatic duct) with short segment narrowing of the posterior right duct at its insertion into the common hepatic duct (CHD); common hepatic duct is also focally narrowed and slightly distorted at this site. Biliary stent appears to transverse the narrowed segment of common hepatic duct with mild upstream and downstream biliary ductal dilatation; note that assessment of stent position by MRI is limited.  Relatively similar thickwalled 3.2 cm fluid collection in the gallbladder fossa; sterility is indeterminant by imaging, however this is favored to represent a small residual biloma. No new fluid collections are identified.    Dr. Jeffory. Lennard: in 04/2008, diagnostic laparoscopy, small bowel serosal nodule excisional biopsy X 2 for CT concerning for possible small bowel obstruction  and CTE with possible small bowel tumor in jejunum in the left side.  POSTOPERATIVE DIAGNOSES:  1. No evidence of endoluminal small bowel tumor.  2. Proximal jejunal small serosal nodules consistent with scarring      from prior radiation.  3. 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  4. 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  5. No evidence of retroperitoneal or intra-abdominal mass status post      hysterectomy, bilateral oophorectomy.   EGD with small bowel biopsy 11/2001: 43 yo with lifelong diarrhea in setting of prior radiation therapy for uterine tumor at 81 months of age. SBFT with abnormal appearing small bowel mucosa. Celiac antibody negative.  IMPRESSION: 1. Multiple distal esophageal erosions with a moderately severe    erosive reflux esophagitis, patulous esophagogastric junction. 2. Small hiatal hernia.  The remainder of the stomach appeared    normal. 3. Slightly pale and poorly distensible second and third portions    of the duodenum, status post biopsies. Path not available  Colonoscopy 10/2001: 43 yo with lifelong diarrhea in setting of prior pelvic radiation for uterine tumor at 82 months of age. IMPRESSION: 1. Normal rectum. 2. Diminutive polyp at 20  cm cold biopsied/removed. 3. The remainder of colonic mucosa appeared normal status post stool sampling    and biopsies as described above. Path not available     Medications   Current Outpatient Medications  Medication Sig Dispense Refill   albuterol  (VENTOLIN  HFA) 108 (90 Base) MCG/ACT inhaler INHALE 2 PUFFS BY MOUTH EVERY 4 TO 6 HOURS AS NEEDED FOR SHORTNESS OF BREATH 18 g 3   amLODipine  (NORVASC ) 2.5 MG tablet Take 2.5 mg by mouth daily.     cetirizine (ZYRTEC) 10 MG tablet Take 10 mg by mouth daily.     conjugated estrogens  (PREMARIN ) vaginal cream Use twice weekly vaginally 42.5 g 12   cromolyn (OPTICROM) 4 %  ophthalmic solution Place 1 drop into both eyes 4 (four) times daily.     cyanocobalamin  (,VITAMIN B-12,) 1000 MCG/ML injection Inject 1 mL (1,000 mcg total) into the muscle every 30 (thirty) days. 1 mL 11   cyclobenzaprine  (FLEXERIL ) 10 MG tablet TAKE ONE TABLET BY MOUTH THREE TIMES DAILY AS NEEDED 90 tablet 1   dicyclomine (BENTYL) 20 MG tablet Take 20 mg by mouth 4 (four) times daily as needed.     EMGALITY 120 MG/ML SOAJ Inject 1 mL into the skin every 30 (thirty) days.     estradiol  (ESTRACE ) 1 MG tablet Take 1 mg by mouth daily.     nystatin cream (MYCOSTATIN) Apply 1 Application topically 2 (two) times daily.     oxyCODONE  HCl 10 MG TABA Take 10 mg by mouth every 4 (four) hours as needed (up to five times daily).     pantoprazole (PROTONIX) 40 MG tablet Take 1 tablet (40 mg total) by mouth daily before breakfast. 30 tablet 5   progesterone (PROMETRIUM) 200 MG capsule Take 200 mg by mouth at bedtime.     promethazine  (PHENERGAN ) 25 MG suppository Place rectally.     promethazine  (PHENERGAN ) 25 MG tablet TAKE 1 TABLET BY MOUTH EVERY 8 HOURS AS NEEDED FOR NAUSEA AND VOMITING. 30 tablet 2   RESTASIS 0.05 % ophthalmic emulsion      rizatriptan (MAXALT) 10 MG tablet Take 10 mg by mouth as needed.     triamcinolone ointment (KENALOG) 0.5 % Apply topically.     Vitamin D , Ergocalciferol , (DRISDOL ) 1.25 MG (50000 UNIT) CAPS capsule Take 50,000 Units by mouth once a week.     zolpidem (AMBIEN) 5 MG tablet Take 5 mg by mouth at bedtime as needed.     No current facility-administered medications for this visit.    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 Lap   WISDOM TOOTH EXTRACTION      Past Family History   Family History  Problem Relation Age of Onset   Diabetes Father    Crohn's disease Sister    Crohn's disease Maternal Grandmother    Breast cancer Paternal Grandmother    Diabetes Paternal Grandmother    Breast cancer Maternal Aunt    Colon cancer Neg Hx    Celiac disease Neg Hx     Past Social History   Social History   Socioeconomic History   Marital status: Single    Spouse name: Not on file   Number of children: Not on file   Years of education: Not on file   Highest education level: Not on file  Occupational History   Not on file  Tobacco Use   Smoking status: Former    Current packs/day: 0.00    Average packs/day: 0.3 packs/day for 10.0 years (2.5 ttl pk-yrs)    Types: E-cigarettes, Cigarettes    Start date: 06/13/2004    Quit date: 06/13/2014    Years since quitting: 9.9   Smokeless tobacco: Never   Tobacco comments:    vapor cigs  Vaping Use   Vaping status: Every Day  Substance and Sexual Activity   Alcohol use: Not Currently    Comment: socially   Drug use: No   Sexual activity: Yes    Birth control/protection: Surgical, None, Condom    Comment: sexual hx question not answered  Other Topics Concern   Not on file  Social History Narrative   Not on file   Social Drivers of Health   Financial Resource  Strain: Not on file  Food Insecurity: Not on file  Transportation Needs: Not on file  Physical Activity: Not on file  Stress: Not on file  Social Connections: Not on file  Intimate Partner Violence: Not on file    Review of Systems   General: Negative for anorexia, weight loss, fever, chills, fatigue, weakness. See hpi Eyes: Negative for vision changes.  ENT: Negative for hoarseness, difficulty swallowing , nasal congestion. CV: Negative for chest pain, angina, palpitations, dyspnea on exertion, peripheral edema.  Respiratory: Negative for dyspnea at rest, dyspnea on exertion, cough, sputum, wheezing.  GI: See history of present illness. GU:  Negative for dysuria, hematuria, urinary incontinence, urinary frequency, nocturnal urination.  FD:rymnwpr rib/back pain.  Derm: Negative for rash or itching.  Neuro: Negative for weakness, abnormal sensation, seizure,  memory loss,  confusion. +headaches Psych: Negative for  suicidal ideation, hallucinations. +stress Endo: Negative for unusual weight change.  Heme: Negative for bruising or bleeding. Allergy: Negative for rash or hives.  Physical Exam   BP 120/83 (BP Location: Right Arm, Patient Position: Sitting, Cuff Size: Normal)   Pulse 90   Temp 98.9 F (37.2 C) (Oral)   Ht 5' 1 (1.549 m)   Wt 103 lb (46.7 kg)   LMP 08/15/1984   SpO2 98%   BMI 19.46 kg/m    General: Well-nourished, well-developed in no acute distress.  Head: Normocephalic, atraumatic.   Eyes: Conjunctiva pink, no icterus. Mouth: Oropharyngeal mucosa moist and pink  Neck: Supple without thyromegaly, masses, or lymphadenopathy.  Lungs: Clear to auscultation bilaterally.  Heart: Regular rate and rhythm, no murmurs rubs or gallops.  Abdomen: Bowel sounds are normal, nondistended, no  hepatosplenomegaly or masses,  no abdominal bruits or hernia, no rebound or guarding. Right anterior ribs sit at right hip. Bulge noted in ruq. Tenderness in upper abdomen Rectal:  not performed Extremities: No lower extremity edema. No clubbing or deformities.  Neuro: Alert and oriented x 4 , grossly normal neurologically.  Skin: Warm and dry, no rash or jaundice.   Psych: Alert and cooperative, normal mood and affect.  Labs   See hpi  Imaging Studies   No results found.  Assessment/Plan:   Upper abdominal pain associated with N/V: -two recent ED visits in April with intractable symptoms, CT with no acute changes, noted to have stable extrahepatic biliary ductal dilation compared to CT in 2024.  -new elevation of LFTs -symptoms could be biliary, could be due to intermittent partial bowel obstruction. Noted CT 02/2023 small bowel findings as outlined -start with labs, may require further imaging of biliary tree with MR, may require EGD or CTE -continue supportive measures with antiemetics -start pantoprazole 40mg  daily.  Chronic diarrhea: -noted since childhood, cannot exclude radiation enteritis playing major role. Need to consider other possibilities as well -starts with labs, stool tests -use bentyl every morning, try second dose in afternoon if needed -family history of Crohn's disease  Elevated LFTs/dilated biliary tree: -stable biliary dilation by CT -new elevation of LFTs noted in 02/2024. LFTs in 02/2023 were normal. -start with labs, may require MRI liver/MRCP  I spent total of 60 minutes with patient, extensive review of her prior pertinent records.    Sonny RAMAN. Ezzard, MHS, PA-C [facility]

## 2024-05-07 ENCOUNTER — Telehealth: Payer: Self-pay

## 2024-05-07 LAB — C-REACTIVE PROTEIN: CRP: <1 mg/L (ref 0–10)

## 2024-05-07 LAB — SEDIMENTATION RATE: Sed Rate: 5 mm/h (ref 0–32)

## 2024-05-07 NOTE — Telephone Encounter (Signed)
 Received records from [facility] regarding visit patient had in their office. Pt has not been seen in our office since 02/11/2020 and has Kaitlyn Cobbler, FNP listed as her PCP. Notes sent to her via interoffice mail. Mjp,lpn

## 2024-05-11 LAB — ALPHA-GAL PANEL
Allergen Lamb IgE: 1.56 kU/L — AB
Beef IgE: 0.37 kU/L — AB
IgE (Immunoglobulin E), Serum: 184 [IU]/mL (ref 6–495)
O215-IgE Alpha-Gal: 0.15 kU/L — AB
Pork IgE: 2.38 kU/L — AB

## 2024-05-11 LAB — IRON,TIBC AND FERRITIN PANEL
Ferritin: 51 ng/mL (ref 15–150)
Iron Saturation: 20 % (ref 15–55)
Iron: 68 ug/dL (ref 27–159)
Total Iron Binding Capacity: 348 ug/dL (ref 250–450)
UIBC: 280 ug/dL (ref 131–425)

## 2024-05-11 LAB — HEPATITIS B CORE ANTIBODY, TOTAL: Hep B Core Total Ab: NEGATIVE

## 2024-05-11 LAB — COMPREHENSIVE METABOLIC PANEL WITH GFR
ALT: 16 IU/L (ref 0–32)
AST: 19 IU/L (ref 0–40)
Albumin: 4.1 g/dL (ref 3.9–4.9)
Alkaline Phosphatase: 130 IU/L — ABNORMAL HIGH (ref 44–121)
BUN/Creatinine Ratio: 13 (ref 9–23)
BUN: 14 mg/dL (ref 6–24)
Bilirubin Total: <0.2 mg/dL (ref 0.0–1.2)
CO2: 24 mmol/L (ref 20–29)
Calcium: 9.2 mg/dL (ref 8.7–10.2)
Chloride: 102 mmol/L (ref 96–106)
Creatinine, Ser: 1.08 mg/dL — ABNORMAL HIGH (ref 0.57–1.00)
Globulin, Total: 2.5 g/dL (ref 1.5–4.5)
Glucose: 56 mg/dL — ABNORMAL LOW (ref 70–99)
Potassium: 4.1 mmol/L (ref 3.5–5.2)
Sodium: 143 mmol/L (ref 134–144)
Total Protein: 6.6 g/dL (ref 6.0–8.5)
eGFR: 66 mL/min/1.73 (ref >59)

## 2024-05-11 LAB — CBC WITH DIFFERENTIAL/PLATELET
Basophils Absolute: 0 x10E3/uL (ref 0.0–0.2)
Basos: 1 %
EOS (ABSOLUTE): 0.2 x10E3/uL (ref 0.0–0.4)
Eos: 2 %
Hematocrit: 35.4 % (ref 34.0–46.6)
Hemoglobin: 11.7 g/dL (ref 11.1–15.9)
Immature Grans (Abs): 0.1 x10E3/uL (ref 0.0–0.1)
Immature Granulocytes: 1 %
Lymphocytes Absolute: 2.5 x10E3/uL (ref 0.7–3.1)
Lymphs: 38 %
MCH: 30.5 pg (ref 26.6–33.0)
MCHC: 33.1 g/dL (ref 31.5–35.7)
MCV: 92 fL (ref 79–97)
Monocytes Absolute: 0.4 x10E3/uL (ref 0.1–0.9)
Monocytes: 6 %
Neutrophils Absolute: 3.5 x10E3/uL (ref 1.4–7.0)
Neutrophils: 52 %
Platelets: 401 x10E3/uL (ref 150–450)
RBC: 3.84 x10E6/uL (ref 3.77–5.28)
RDW: 12.6 % (ref 11.7–15.4)
WBC: 6.7 x10E3/uL (ref 3.4–10.8)

## 2024-05-11 LAB — IGG, IGA, IGM
IgA/Immunoglobulin A, Serum: 189 mg/dL (ref 87–352)
IgG (Immunoglobin G), Serum: 1031 mg/dL (ref 586–1602)
IgM (Immunoglobulin M), Srm: 123 mg/dL (ref 26–217)

## 2024-05-11 LAB — ANTINUCLEAR ANTIBODIES, IFA: ANA Titer 1: NEGATIVE

## 2024-05-11 LAB — HEPATITIS B SURFACE ANTIBODY,QUALITATIVE

## 2024-05-11 LAB — VITAMIN B12: Vitamin B-12: >2000 pg/mL — ABNORMAL HIGH (ref 232–1245)

## 2024-05-11 LAB — ALPHA-1-ANTITRYPSIN PHENOTYP: A-1 Antitrypsin: 164 mg/dL (ref 101–187)

## 2024-05-11 LAB — HEPATITIS B SURFACE ANTIGEN: Hepatitis B Surface Ag: NEGATIVE

## 2024-05-11 LAB — TISSUE TRANSGLUTAMINASE, IGA: Transglutaminase IgA: <2 U/mL (ref 0–3)

## 2024-05-11 LAB — MITOCHONDRIAL/SMOOTH MUSCLE AB PNL
Mitochondrial Ab: <20 U (ref 0.0–20.0)
Smooth Muscle Ab: 6 U (ref 0–19)

## 2024-05-11 LAB — HEPATITIS C ANTIBODY: Hep C Virus Ab: NONREACTIVE

## 2024-05-11 LAB — LIPASE: Lipase: 19 U/L (ref 14–72)

## 2024-05-14 LAB — GI PROFILE, STOOL, PCR

## 2024-05-14 LAB — PANCREATIC ELASTASE, FECAL: Pancreatic Elastase, Fecal: >800 ug Elast./g (ref >200)

## 2024-05-14 LAB — CALPROTECTIN, FECAL: Calprotectin, Fecal: 78 ug/g (ref 0–120)

## 2024-05-23 ENCOUNTER — Ambulatory Visit: Payer: Self-pay | Admitting: Gastroenterology

## 2024-05-25 ENCOUNTER — Other Ambulatory Visit: Payer: Self-pay | Admitting: *Deleted

## 2024-05-25 DIAGNOSIS — Z7689 Persons encountering health services in other specified circumstances: Secondary | ICD-10-CM

## 2024-06-18 ENCOUNTER — Encounter: Payer: Self-pay | Admitting: *Deleted

## 2024-06-18 ENCOUNTER — Other Ambulatory Visit: Payer: Self-pay | Admitting: *Deleted

## 2024-06-18 MED ORDER — CLENPIQ 10-3.5-12 MG-GM -GM/175ML PO SOLN: 1.0000 | ORAL | 0 refills | Status: AC

## 2024-07-23 ENCOUNTER — Encounter (HOSPITAL_COMMUNITY): Admission: RE | Payer: Self-pay | Source: Home / Self Care

## 2024-07-23 ENCOUNTER — Telehealth: Payer: Self-pay | Admitting: *Deleted

## 2024-07-23 ENCOUNTER — Ambulatory Visit (HOSPITAL_COMMUNITY): Admission: RE | Admit: 2024-07-23 | Source: Home / Self Care | Admitting: Internal Medicine

## 2024-07-23 SURGERY — COLONOSCOPY
Anesthesia: Choice

## 2024-07-23 NOTE — Telephone Encounter (Signed)
 Pt will is cancelling her procedure today. She says she tried doing the prep but kept violently throwing it back up and she doesn't feel like she is cleaned out. Please advise. Thank you

## 2024-07-27 NOTE — Telephone Encounter (Signed)
 Would offer her follow up ov to determine next step.

## 2024-07-27 NOTE — Telephone Encounter (Signed)
 Please arrange a f/u ov to discuss next steps.

## 2024-08-07 NOTE — Telephone Encounter (Signed)
 noted

## 2024-09-30 ENCOUNTER — Emergency Department (HOSPITAL_COMMUNITY)
Admission: EM | Admit: 2024-09-30 | Discharge: 2024-10-01 | Disposition: A | Discharge status: Other Acute Inpatient Hospital | Attending: Emergency Medicine | Admitting: Emergency Medicine

## 2024-09-30 ENCOUNTER — Emergency Department (HOSPITAL_COMMUNITY)

## 2024-09-30 ENCOUNTER — Encounter (HOSPITAL_COMMUNITY): Payer: Self-pay

## 2024-09-30 ENCOUNTER — Other Ambulatory Visit: Payer: Self-pay

## 2024-09-30 DIAGNOSIS — R197 Diarrhea, unspecified: Secondary | ICD-10-CM | POA: Insufficient documentation

## 2024-09-30 DIAGNOSIS — R1084 Generalized abdominal pain: Secondary | ICD-10-CM | POA: Insufficient documentation

## 2024-09-30 DIAGNOSIS — Z79899 Other long term (current) drug therapy: Secondary | ICD-10-CM | POA: Diagnosis not present

## 2024-09-30 DIAGNOSIS — R079 Chest pain, unspecified: Secondary | ICD-10-CM | POA: Insufficient documentation

## 2024-09-30 DIAGNOSIS — K8041 Calculus of bile duct with cholecystitis, unspecified, with obstruction: Secondary | ICD-10-CM

## 2024-09-30 DIAGNOSIS — R109 Unspecified abdominal pain: Secondary | ICD-10-CM | POA: Diagnosis present

## 2024-09-30 DIAGNOSIS — I10 Essential (primary) hypertension: Secondary | ICD-10-CM | POA: Insufficient documentation

## 2024-09-30 DIAGNOSIS — R112 Nausea with vomiting, unspecified: Secondary | ICD-10-CM | POA: Diagnosis not present

## 2024-09-30 LAB — LIPASE, BLOOD: Lipase: 11 U/L (ref 11–51)

## 2024-09-30 LAB — CBC
HCT: 36.6 % (ref 36.0–46.0)
Hemoglobin: 12.7 g/dL (ref 12.0–15.0)
MCH: 30.1 pg (ref 26.0–34.0)
MCHC: 34.7 g/dL (ref 30.0–36.0)
MCV: 86.7 fL (ref 80.0–100.0)
Platelets: 307 K/uL (ref 150–400)
RBC: 4.22 MIL/uL (ref 3.87–5.11)
RDW: 12.3 % (ref 11.5–15.5)
WBC: 14.7 K/uL — ABNORMAL HIGH (ref 4.0–10.5)
nRBC: 0 % (ref 0.0–0.2)

## 2024-09-30 LAB — BASIC METABOLIC PANEL WITH GFR
Anion gap: 15 (ref 5–15)
BUN: 13 mg/dL (ref 6–20)
CO2: 25 mmol/L (ref 22–32)
Calcium: 9.5 mg/dL (ref 8.9–10.3)
Chloride: 100 mmol/L (ref 98–111)
Creatinine, Ser: 1.04 mg/dL — ABNORMAL HIGH (ref 0.44–1.00)
GFR, Estimated: >60 mL/min (ref >60)
Glucose, Bld: 118 mg/dL — ABNORMAL HIGH (ref 70–99)
Potassium: 3.3 mmol/L — ABNORMAL LOW (ref 3.5–5.1)
Sodium: 140 mmol/L (ref 135–145)

## 2024-09-30 LAB — HEPATIC FUNCTION PANEL
ALT: 26 U/L (ref 0–44)
AST: 41 U/L (ref 15–41)
Albumin: 4.5 g/dL (ref 3.5–5.0)
Alkaline Phosphatase: 248 U/L — ABNORMAL HIGH (ref 38–126)
Bilirubin, Direct: 0.4 mg/dL — ABNORMAL HIGH (ref 0.0–0.2)
Indirect Bilirubin: 0.4 mg/dL (ref 0.3–0.9)
Total Bilirubin: 0.8 mg/dL (ref 0.0–1.2)
Total Protein: 7.4 g/dL (ref 6.5–8.1)

## 2024-09-30 LAB — TROPONIN T, HIGH SENSITIVITY: Troponin T High Sensitivity: <15 ng/L (ref 0–19)

## 2024-09-30 MED ORDER — LACTATED RINGERS IV BOLUS
1000.0000 mL | Freq: Once | INTRAVENOUS | Status: AC
  Administered 2024-09-30: 1000 mL via INTRAVENOUS

## 2024-09-30 MED ORDER — MORPHINE SULFATE (PF) 4 MG/ML IV SOLN
4.0000 mg | Freq: Once | INTRAVENOUS | Status: AC
Start: 2024-09-30 — End: 2024-09-30
  Administered 2024-09-30: 4 mg via INTRAVENOUS
  Filled 2024-09-30: qty 1

## 2024-09-30 MED ORDER — GADOBUTROL 1 MMOL/ML IV SOLN
9.0000 mL | Freq: Once | INTRAVENOUS | Status: AC | PRN
  Administered 2024-09-30: 5 mL via INTRAVENOUS

## 2024-09-30 MED ORDER — ONDANSETRON HCL 4 MG/2ML IJ SOLN
4.0000 mg | Freq: Once | INTRAMUSCULAR | Status: AC
  Administered 2024-09-30: 4 mg via INTRAVENOUS
  Filled 2024-09-30: qty 2

## 2024-09-30 MED ORDER — IOHEXOL 300 MG/ML  SOLN
100.0000 mL | Freq: Once | INTRAMUSCULAR | Status: AC | PRN
  Administered 2024-09-30: 100 mL via INTRAVENOUS

## 2024-09-30 NOTE — ED Notes (Signed)
 Patient transported to MRI

## 2024-09-30 NOTE — ED Triage Notes (Signed)
 Pt arrived POV for chest pain, abd pain and SOB. Pt says the chest pain and SOB feels like a rubber band across her chest. Pt has a palpable mass on the center of her chest. Pt takes 2.5mg  of amlodipine  PRN

## 2024-09-30 NOTE — ED Provider Notes (Signed)
 Pamela Wall EMERGENCY DEPARTMENT AT [facility] Provider Note   CSN: [id number] Arrival date & time: 09/30/24  1329     Patient presents with: Chest Pain   Pamela Wall is a 43 y.o. female. Presents the ER today for evaluation of abdominal pain, nausea vomiting, diarrhea as well as chest pain.  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  Reports in the past she has had episodes of abdominal pain with nausea vomiting diarrhea requiring hospitalization.  She states at 1 point they thought she had a bowel obstruction, did surgery and could not find obstruction and ran her bowel.  Then, subsequently, she had a cholecystectomy.  Feels like prior episode of bowel obstruction but today she is also having pain in the center of her chest rating to her left shoulder and left jaw.  No history of CAD, no family history of heart disease.  Pain is constant and worse with vomiting.  No shortness of breath.  She is she does vape nicotine and has history of hypertension. She also reports tenderness to the center of her chest, states she can feel something there but she has not felt before.     Chest Pain      Prior to Admission medications   Medication Sig Start Date End Date Taking? Authorizing Provider  albuterol  (VENTOLIN  HFA) 108 (90 Base) MCG/ACT inhaler INHALE 2 PUFFS BY MOUTH EVERY 4 TO 6 HOURS AS NEEDED FOR SHORTNESS OF BREATH 10/24/20   Duanne Butler DASEN, MD  amLODipine  (NORVASC ) 2.5 MG tablet Take 2.5 mg by mouth daily. 03/16/24   [provider]  cetirizine (ZYRTEC) 10 MG tablet Take 10 mg by mouth daily. 03/16/24   [provider]  conjugated estrogens  (PREMARIN ) vaginal cream Use twice weekly vaginally 04/28/19   Neysa Inocente PARAS, NP  cromolyn (OPTICROM) 4 % ophthalmic solution Place 1 drop into both eyes 4 (four) times daily. 04/03/24   [provider]   cyanocobalamin  (,VITAMIN B-12,) 1000 MCG/ML injection Inject 1 mL (1,000 mcg total) into the muscle every 30 (thirty) days. 04/11/20   Duanne Butler DASEN, MD  cyclobenzaprine  (FLEXERIL ) 10 MG tablet TAKE ONE TABLET BY MOUTH THREE TIMES DAILY AS NEEDED 06/23/21   Duanne Butler DASEN, MD  dicyclomine (BENTYL) 20 MG tablet Take 20 mg by mouth 4 (four) times daily as needed. 03/26/24   [provider]  EMGALITY 120 MG/ML SOAJ Inject 1 mL into the skin every 30 (thirty) days. 04/30/24   [provider]  estradiol  (ESTRACE ) 1 MG tablet Take 1 mg by mouth daily. 03/16/24   [provider]  nystatin cream (MYCOSTATIN) Apply 1 Application topically 2 (two) times daily. 03/16/24   [provider]  oxyCODONE  HCl 10 MG TABA Take 10 mg by mouth every 4 (four) hours as needed (up to five times daily).    [provider]  pantoprazole  (PROTONIX ) 40 MG tablet Take 1 tablet (40 mg total) by mouth daily before breakfast. 05/05/24   Ezzard Sonny RAMAN, PA-C  progesterone (PROMETRIUM) 200 MG capsule Take 200 mg by mouth at bedtime. 03/16/24   [provider]  promethazine  (PHENERGAN ) 25 MG suppository Place rectally. 03/04/24   [provider]  promethazine  (PHENERGAN ) 25 MG tablet TAKE 1 TABLET BY MOUTH EVERY 8 HOURS AS NEEDED FOR NAUSEA AND VOMITING. 03/25/20   Duanne Butler DASEN, MD  RESTASIS 0.05 % ophthalmic emulsion  04/03/24   [provider]  rizatriptan (MAXALT) 10 MG tablet Take 10 mg by mouth as needed. 03/16/24   [provider]  Sod Picosulfate-Mag Ox-Cit Acd (CLENPIQ ) 10-3.5-12 MG-GM -GM/175ML SOLN Take 1 kit by mouth as directed. 06/18/24   Rourk, Lamar HERO, MD  triamcinolone ointment (KENALOG) 0.5 % Apply topically. 02/21/24   [provider]  Vitamin D , Ergocalciferol , (DRISDOL ) 1.25 MG (50000 UNIT) CAPS capsule Take 50,000 Units by mouth once a week. 02/21/24   [provider]  zolpidem (AMBIEN) 5 MG tablet Take 5 mg by mouth  at bedtime as needed. 02/21/24   [provider]    Allergies: Dog epithelium and Influenza vaccines    Review of Systems  Cardiovascular:  Positive for chest pain.    Updated Vital Signs BP (!) 117/98 (BP Location: Right Arm) Comment: Simultaneous filing. User may not have seen previous data. Comment (BP Location): Simultaneous filing. User may not have seen previous data.  Pulse 97   Temp 98.4 F (36.9 C) (Oral)   Resp 19   Wt 41.7 kg   LMP 08/15/1984   SpO2 99% Comment: Simultaneous filing. User may not have seen previous data.  BMI 17.38 kg/m   Physical Exam Vitals and nursing note reviewed.  Constitutional:      General: She is not in acute distress.    Appearance: She is well-developed.  HENT:     Head: Normocephalic and atraumatic.  Eyes:     Conjunctiva/sclera: Conjunctivae normal.     Pupils: Pupils are equal, round, and reactive to light.  Cardiovascular:     Rate and Rhythm: Normal rate and regular rhythm.     Heart sounds: No murmur heard. Pulmonary:     Effort: Pulmonary effort is normal. No respiratory distress.     Breath sounds: Normal breath sounds.  Chest:     Comments: Reproducible tenderness over the xiphoid process without crepitus, patient reports this corresponds with her area that she felt a bump that she had not noticed before. Abdominal:     General: Bowel sounds are decreased.     Palpations: Abdomen is soft.     Tenderness: There is generalized abdominal tenderness.     Comments: Multiple well-healed scars of abdomen  Musculoskeletal:        General: No swelling.     Cervical back: Neck supple.     Right lower leg: No tenderness. No edema.     Left lower leg: No tenderness. No edema.  Skin:    General: Skin is warm and dry.     Capillary Refill: Capillary refill takes less than 2 seconds.  Neurological:     General: No focal deficit present.     Mental Status: She is alert and oriented to person, place, and time.  Psychiatric:         Mood and Affect: Mood normal.     (all labs ordered are listed, but only abnormal results are displayed) Labs Reviewed  BASIC METABOLIC PANEL WITH GFR  CBC  LIPASE, BLOOD  TROPONIN T, HIGH SENSITIVITY    EKG: EKG Interpretation Date/Time:  Wednesday September 30 2024 14:14:23 EST Ventricular Rate:  96 PR Interval:  145 QRS Duration:  90 QT Interval:  366 QTC Calculation: 463 R Axis:   80  Text Interpretation: Sinus rhythm Nonspecific ST abnormality Confirmed by Yolande Lamar [phone]) on 09/30/2024 2:22:07 PM  Radiology: No results found.   Procedures   Medications Ordered in the ED  ondansetron  (  ZOFRAN ) injection 4 mg (has no administration in time range)  lactated ringers  bolus 1,000 mL (has no administration in time range)    Clinical Course as of 09/30/24 2357  Wed Sep 30, 2024  1451 With nausea vomiting diarrhea-patient has multiple surgeries, history of possible obstruction, will get labs, give fluids and nausea pain medicine, and plan on CT. Reporting chest pain going to left shoulder and jaw, nonspecific ST changes on EKG, the area of tenderness she is reporting corresponds with her xiphoid process I do not feel a palpable mass in this area.  Could be tenderness from repeated vomiting, will get troponins and chest x-ray.  [CB]  2355 I spoke with Dr. Reno Fairly at [facility] from GI.  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  He requested hospitalist to admit.  Paged out at this time.  Pending callback. [CB]    Clinical Course User Index [CB] Suellen Sherran LABOR, PA-C                                 Medical Decision Making Differential diagnosis includes but is not limited to bowel obstruction, pancreatitis, colitis, gastroenteritis, other  ED course: Patient presents with nausea vomiting diarrhea that started at 1 AM.  She had similar bouts in the past  but states this time she is having some pain rating into her chest as well.  ACS workup was done and is negative.  Chest x-ray showed no pulmonary edema or infiltrate.  No suspicion clinically for PE and patient is low risk, negative on PERC criteria.  CT abdomen pelvis was ordered due to patient's pain and lack of any recent abdominal imaging.  CT showed moderate intrahepatic bile duct dilatation without any obvious stone on CT, radiology read recommended MRCP.  MRCP was ordered, unfortunately nobody rads on overnight so I called radiology to ask for preliminary read.  I received phone call back from the radiologist.  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  Patient has had prior surgeries at [facility].  This is who performed repair of her bile leak in 2015.,  We have no ERCP here, she request transport to [facility] if possible.  I have consulted them and awaiting callback.  Signed out to Dr. Geroldine pending hospitalist consult.  Amount and/or Complexity of Data Reviewed Labs: ordered.    Details: Blood cell count 15 Radiology: ordered.  Risk Prescription drug management.        Final diagnoses:  None    ED Discharge Orders     None          Suellen Sherran LABOR DEVONNA 09/30/24 2357    Cleotilde Rogue, MD 10/01/24 1213

## 2024-10-01 MED ORDER — SODIUM CHLORIDE 0.9 % IV SOLN: 12.5000 mg | Freq: Four times a day (QID) | INTRAVENOUS | Status: DC | PRN

## 2024-10-01 MED ORDER — ONDANSETRON HCL 4 MG/2ML IJ SOLN
4.0000 mg | Freq: Once | INTRAMUSCULAR | Status: AC
Start: 2024-10-01 — End: 2024-10-01
  Administered 2024-10-01: 4 mg via INTRAVENOUS
  Filled 2024-10-01: qty 2

## 2024-10-01 MED ORDER — MORPHINE SULFATE (PF) 4 MG/ML IV SOLN
4.0000 mg | Freq: Once | INTRAVENOUS | Status: AC
  Administered 2024-10-01: 4 mg via INTRAVENOUS
  Filled 2024-10-01: qty 1

## 2024-10-01 NOTE — ED Notes (Signed)
 Report Called to [facility] RN @ [address]

## 2024-12-02 ENCOUNTER — Other Ambulatory Visit: Payer: Self-pay | Admitting: Gastroenterology
